# Patient Record
Sex: Female | Born: 1960 | Race: White | Hispanic: No | Marital: Married | Smoking: Current every day smoker
Health system: Southern US, Community
[De-identification: ages and names within clinical notes are randomized; demographics above are authoritative.]

## PROBLEM LIST (undated history)

## (undated) DIAGNOSIS — F419 Anxiety disorder, unspecified: Secondary | ICD-10-CM

## (undated) DIAGNOSIS — Z9889 Other specified postprocedural states: Secondary | ICD-10-CM

## (undated) DIAGNOSIS — M1990 Unspecified osteoarthritis, unspecified site: Secondary | ICD-10-CM

## (undated) DIAGNOSIS — R112 Nausea with vomiting, unspecified: Secondary | ICD-10-CM

## (undated) DIAGNOSIS — N289 Disorder of kidney and ureter, unspecified: Secondary | ICD-10-CM

## (undated) HISTORY — PX: KIDNEY SURGERY: SHX687

## (undated) HISTORY — PX: HERNIA REPAIR: SHX51

---

## 2002-03-11 ENCOUNTER — Ambulatory Visit (HOSPITAL_COMMUNITY): Admission: RE | Admit: 2002-03-11 | Discharge: 2002-03-11 | Payer: Self-pay | Admitting: Obstetrics and Gynecology

## 2002-03-11 ENCOUNTER — Encounter: Payer: Self-pay | Admitting: Obstetrics and Gynecology

## 2003-04-20 ENCOUNTER — Ambulatory Visit (HOSPITAL_COMMUNITY): Admission: RE | Admit: 2003-04-20 | Discharge: 2003-04-20 | Payer: Self-pay | Admitting: Obstetrics and Gynecology

## 2005-01-15 ENCOUNTER — Emergency Department (HOSPITAL_COMMUNITY): Admission: EM | Admit: 2005-01-15 | Discharge: 2005-01-15 | Payer: Self-pay | Admitting: Emergency Medicine

## 2005-01-17 ENCOUNTER — Ambulatory Visit: Payer: Self-pay | Admitting: Orthopedic Surgery

## 2005-01-31 ENCOUNTER — Ambulatory Visit: Payer: Self-pay | Admitting: Orthopedic Surgery

## 2005-02-07 ENCOUNTER — Encounter (HOSPITAL_COMMUNITY): Admission: RE | Admit: 2005-02-07 | Discharge: 2005-02-21 | Payer: Self-pay | Admitting: Orthopedic Surgery

## 2005-03-22 ENCOUNTER — Ambulatory Visit: Payer: Self-pay | Admitting: Orthopedic Surgery

## 2007-09-25 ENCOUNTER — Other Ambulatory Visit: Admission: RE | Admit: 2007-09-25 | Discharge: 2007-09-25 | Payer: Self-pay | Admitting: Obstetrics and Gynecology

## 2008-12-07 ENCOUNTER — Other Ambulatory Visit: Admission: RE | Admit: 2008-12-07 | Discharge: 2008-12-07 | Payer: Self-pay | Admitting: Obstetrics and Gynecology

## 2008-12-13 ENCOUNTER — Ambulatory Visit (HOSPITAL_COMMUNITY): Admission: RE | Admit: 2008-12-13 | Discharge: 2008-12-13 | Payer: Self-pay | Admitting: Obstetrics and Gynecology

## 2011-02-09 ENCOUNTER — Other Ambulatory Visit: Payer: Self-pay | Admitting: Adult Health

## 2011-02-09 ENCOUNTER — Other Ambulatory Visit (HOSPITAL_COMMUNITY)
Admission: RE | Admit: 2011-02-09 | Discharge: 2011-02-09 | Disposition: A | Discharge status: Home / Self Care | Payer: Self-pay | Source: Ambulatory Visit | Attending: Obstetrics and Gynecology | Admitting: Obstetrics and Gynecology

## 2011-02-09 DIAGNOSIS — Z01419 Encounter for gynecological examination (general) (routine) without abnormal findings: Secondary | ICD-10-CM | POA: Insufficient documentation

## 2018-11-26 DIAGNOSIS — E039 Hypothyroidism, unspecified: Secondary | ICD-10-CM | POA: Diagnosis not present

## 2018-11-26 DIAGNOSIS — Z13220 Encounter for screening for lipoid disorders: Secondary | ICD-10-CM | POA: Diagnosis not present

## 2018-11-26 DIAGNOSIS — Z131 Encounter for screening for diabetes mellitus: Secondary | ICD-10-CM | POA: Diagnosis not present

## 2018-11-26 DIAGNOSIS — Z23 Encounter for immunization: Secondary | ICD-10-CM | POA: Diagnosis not present

## 2018-11-26 DIAGNOSIS — R5383 Other fatigue: Secondary | ICD-10-CM | POA: Diagnosis not present

## 2018-12-25 DIAGNOSIS — Z6833 Body mass index (BMI) 33.0-33.9, adult: Secondary | ICD-10-CM | POA: Diagnosis not present

## 2018-12-25 DIAGNOSIS — Z01419 Encounter for gynecological examination (general) (routine) without abnormal findings: Secondary | ICD-10-CM | POA: Diagnosis not present

## 2019-01-05 DIAGNOSIS — K8020 Calculus of gallbladder without cholecystitis without obstruction: Secondary | ICD-10-CM | POA: Diagnosis not present

## 2019-01-05 DIAGNOSIS — K469 Unspecified abdominal hernia without obstruction or gangrene: Secondary | ICD-10-CM | POA: Diagnosis not present

## 2019-01-05 DIAGNOSIS — K760 Fatty (change of) liver, not elsewhere classified: Secondary | ICD-10-CM | POA: Diagnosis not present

## 2019-07-02 ENCOUNTER — Other Ambulatory Visit (HOSPITAL_COMMUNITY): Payer: Self-pay | Admitting: Physician Assistant

## 2019-07-02 DIAGNOSIS — Z1231 Encounter for screening mammogram for malignant neoplasm of breast: Secondary | ICD-10-CM

## 2019-08-05 ENCOUNTER — Ambulatory Visit (HOSPITAL_COMMUNITY)
Admission: RE | Admit: 2019-08-05 | Discharge: 2019-08-05 | Disposition: A | Discharge status: Home / Self Care | Payer: 59 | Source: Ambulatory Visit | Attending: Physician Assistant | Admitting: Physician Assistant

## 2019-08-05 ENCOUNTER — Other Ambulatory Visit: Payer: Self-pay

## 2019-08-05 DIAGNOSIS — Z1231 Encounter for screening mammogram for malignant neoplasm of breast: Secondary | ICD-10-CM | POA: Diagnosis present

## 2021-05-16 IMAGING — MG DIGITAL SCREENING BILAT W/ TOMO W/ CAD
6 of 10 series · 6 of 30 positions shown · non-contrast
Comparison: Previous exam(s).

CLINICAL DATA: Screening.

EXAM:
DIGITAL SCREENING BILATERAL MAMMOGRAM WITH TOMO AND CAD

[L MLO synth-2D]
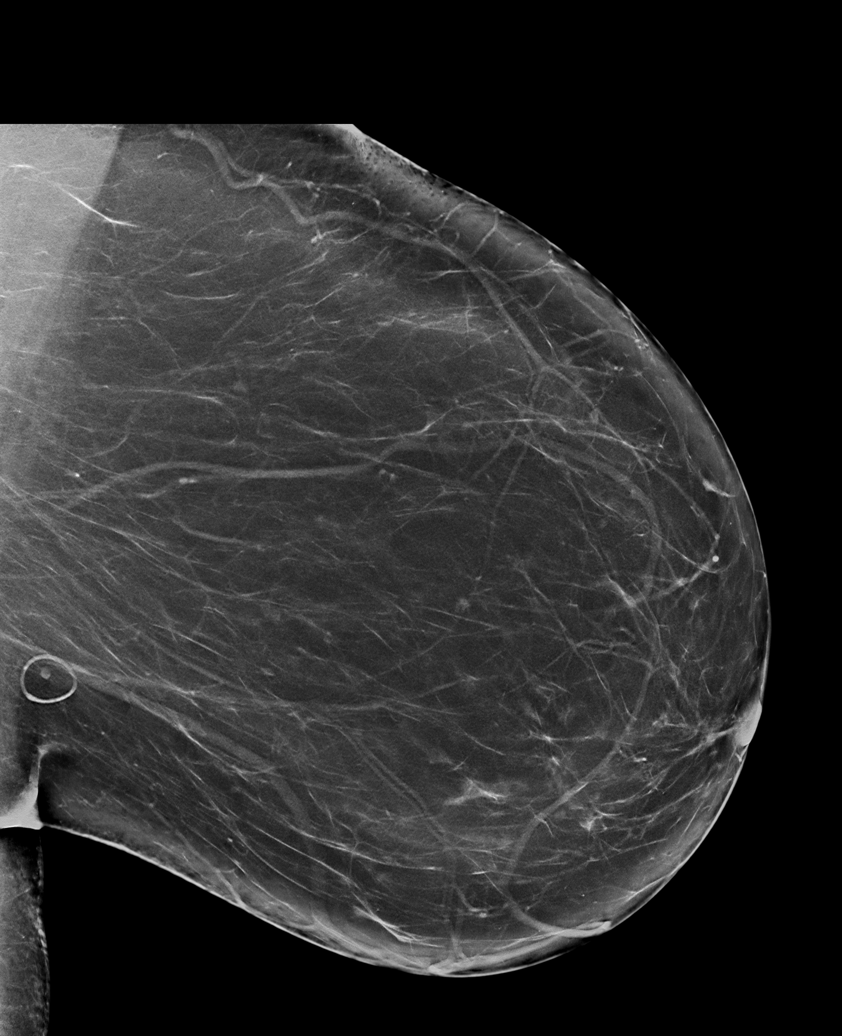

[L CC synth-2D]
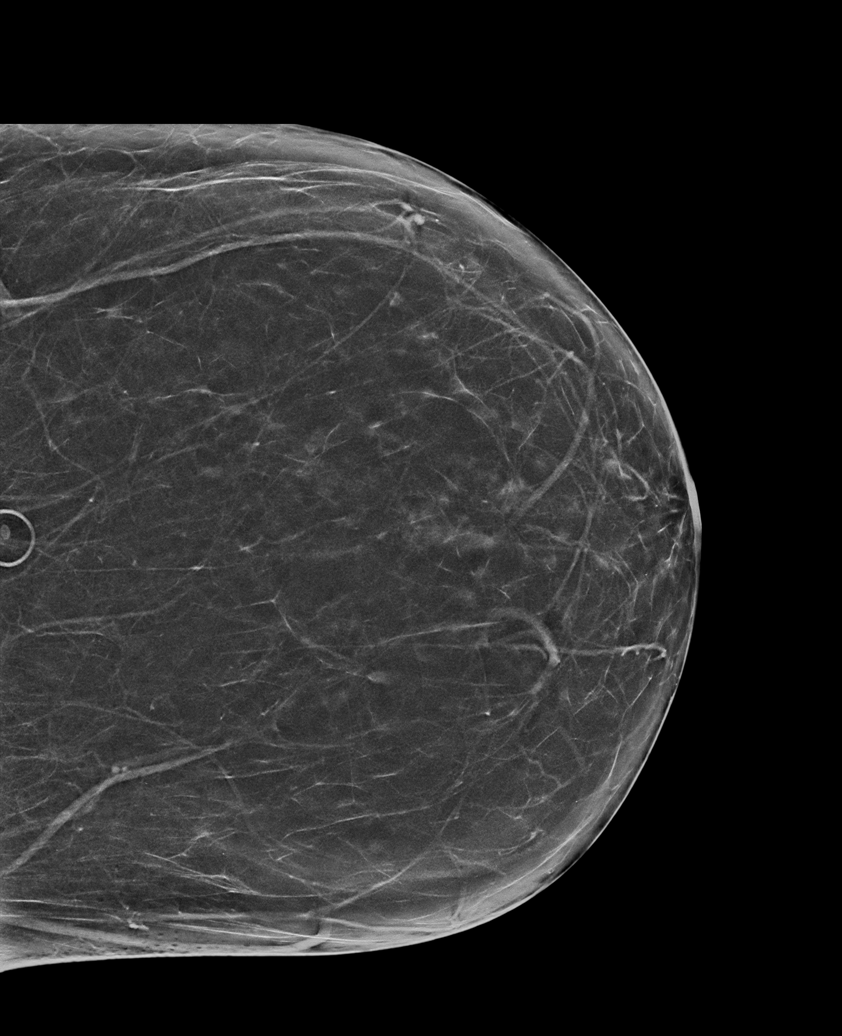

[R MLO synth-2D (1 of 2)]
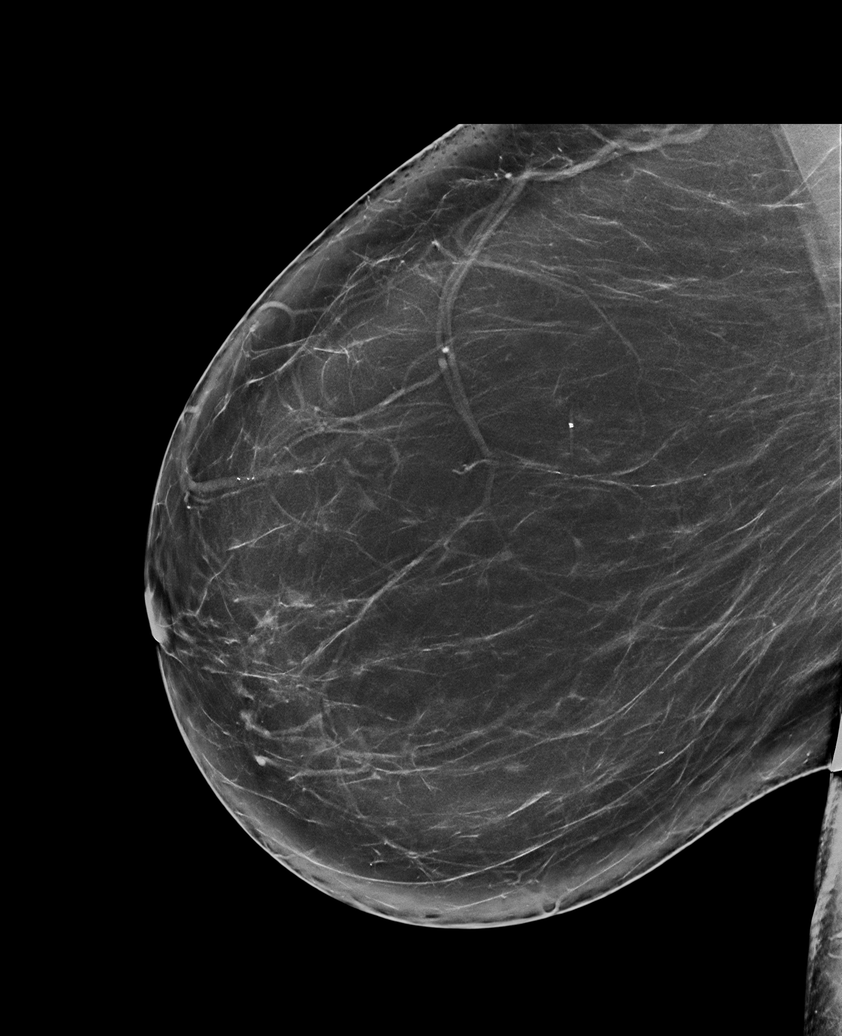

[R CC synth-2D]
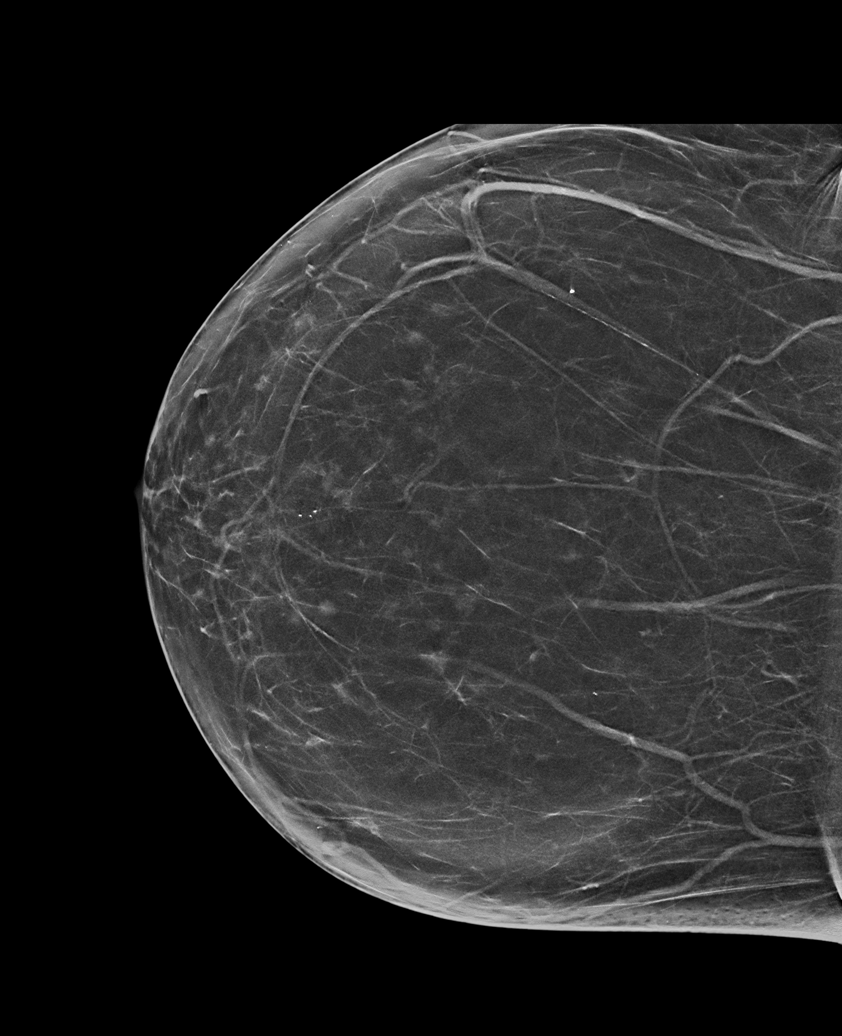

[R MLO synth-2D (2 of 2)]
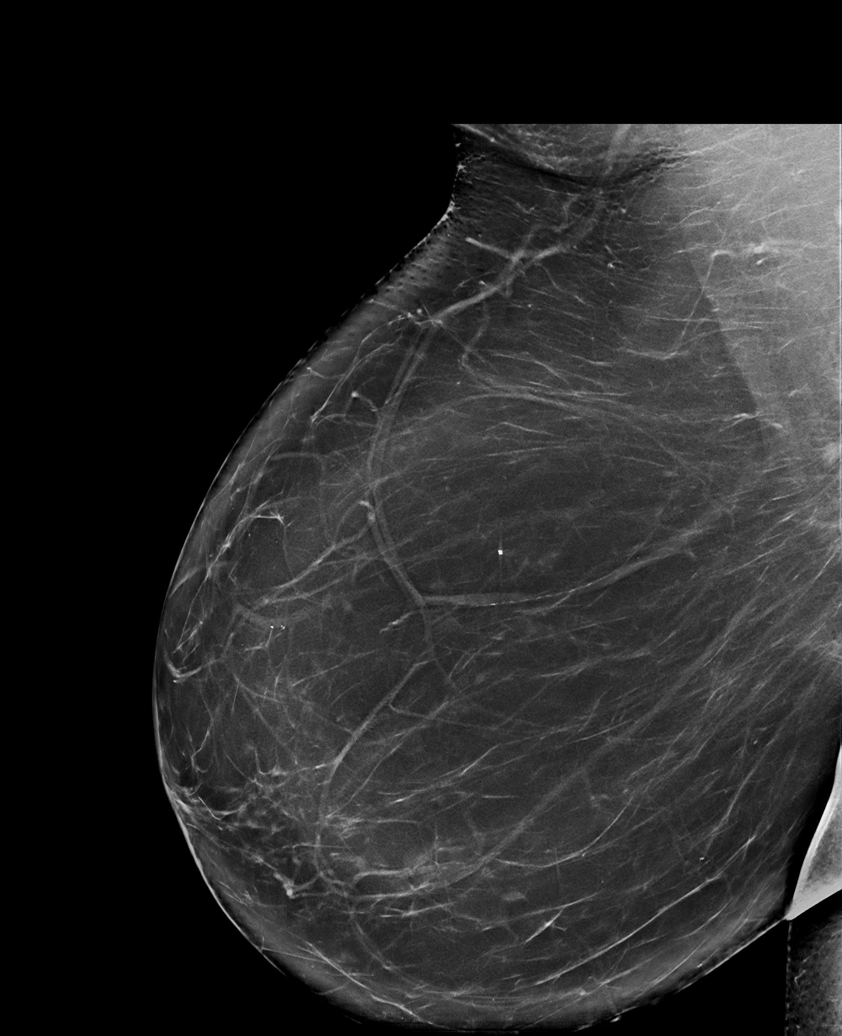

[R CC tomo · tomo slice 37/73.0]
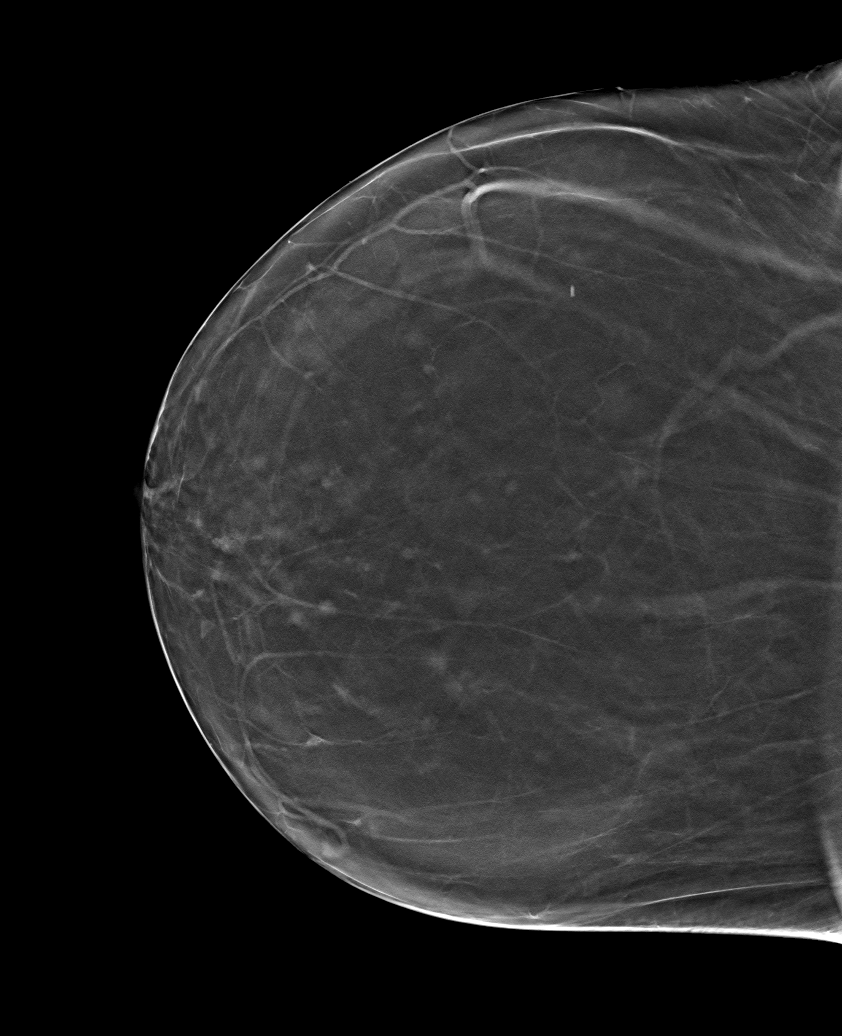

[6 of 30 positions shown; findings below may reference images not displayed]

ACR Breast Density Category b: There are scattered areas of
fibroglandular density.
FINDINGS: There are no findings suspicious for malignancy. Images were
processed with CAD.
IMPRESSION: No mammographic evidence of malignancy. A result letter of this
screening mammogram will be mailed directly to the patient.

RECOMMENDATION:
Screening mammogram in one year. (Code:[id number])

BI-RADS CATEGORY  1: Negative.

## 2022-01-03 ENCOUNTER — Other Ambulatory Visit (HOSPITAL_COMMUNITY): Payer: Self-pay | Admitting: Physician Assistant

## 2022-01-03 DIAGNOSIS — Z1231 Encounter for screening mammogram for malignant neoplasm of breast: Secondary | ICD-10-CM

## 2022-03-18 DIAGNOSIS — Z20822 Contact with and (suspected) exposure to covid-19: Secondary | ICD-10-CM | POA: Diagnosis not present

## 2022-03-18 DIAGNOSIS — J40 Bronchitis, not specified as acute or chronic: Secondary | ICD-10-CM | POA: Diagnosis not present

## 2022-03-18 DIAGNOSIS — R059 Cough, unspecified: Secondary | ICD-10-CM | POA: Diagnosis not present

## 2022-04-11 ENCOUNTER — Ambulatory Visit (HOSPITAL_COMMUNITY)
Admission: RE | Admit: 2022-04-11 | Discharge: 2022-04-11 | Disposition: A | Discharge status: Home / Self Care | Payer: 59 | Source: Ambulatory Visit | Attending: Physician Assistant | Admitting: Physician Assistant

## 2022-04-11 DIAGNOSIS — Z1231 Encounter for screening mammogram for malignant neoplasm of breast: Secondary | ICD-10-CM | POA: Insufficient documentation

## 2022-04-16 ENCOUNTER — Other Ambulatory Visit (HOSPITAL_COMMUNITY): Payer: Self-pay | Admitting: Physician Assistant

## 2022-04-16 DIAGNOSIS — R928 Other abnormal and inconclusive findings on diagnostic imaging of breast: Secondary | ICD-10-CM

## 2022-05-01 ENCOUNTER — Ambulatory Visit (HOSPITAL_COMMUNITY)
Admission: RE | Admit: 2022-05-01 | Discharge: 2022-05-01 | Disposition: A | Discharge status: Home / Self Care | Payer: 59 | Source: Ambulatory Visit | Attending: Physician Assistant | Admitting: Physician Assistant

## 2022-05-01 ENCOUNTER — Encounter (HOSPITAL_COMMUNITY): Payer: Self-pay

## 2022-05-01 DIAGNOSIS — R928 Other abnormal and inconclusive findings on diagnostic imaging of breast: Secondary | ICD-10-CM

## 2022-06-21 ENCOUNTER — Encounter (HOSPITAL_COMMUNITY): Payer: Self-pay | Admitting: Emergency Medicine

## 2022-06-21 ENCOUNTER — Other Ambulatory Visit: Payer: Self-pay

## 2022-06-21 ENCOUNTER — Emergency Department (HOSPITAL_COMMUNITY): Payer: Medicaid Other

## 2022-06-21 ENCOUNTER — Emergency Department (HOSPITAL_COMMUNITY)
Admission: EM | Admit: 2022-06-21 | Discharge: 2022-06-22 | Disposition: A | Discharge status: Home / Self Care | Payer: Medicaid Other | Attending: Emergency Medicine | Admitting: Emergency Medicine

## 2022-06-21 DIAGNOSIS — Z20822 Contact with and (suspected) exposure to covid-19: Secondary | ICD-10-CM | POA: Insufficient documentation

## 2022-06-21 DIAGNOSIS — R509 Fever, unspecified: Secondary | ICD-10-CM | POA: Diagnosis present

## 2022-06-21 DIAGNOSIS — K8689 Other specified diseases of pancreas: Secondary | ICD-10-CM

## 2022-06-21 DIAGNOSIS — M542 Cervicalgia: Secondary | ICD-10-CM | POA: Diagnosis not present

## 2022-06-21 DIAGNOSIS — R519 Headache, unspecified: Secondary | ICD-10-CM | POA: Insufficient documentation

## 2022-06-21 DIAGNOSIS — N12 Tubulo-interstitial nephritis, not specified as acute or chronic: Secondary | ICD-10-CM | POA: Insufficient documentation

## 2022-06-21 DIAGNOSIS — R251 Tremor, unspecified: Secondary | ICD-10-CM | POA: Insufficient documentation

## 2022-06-21 DIAGNOSIS — K869 Disease of pancreas, unspecified: Secondary | ICD-10-CM | POA: Diagnosis not present

## 2022-06-21 HISTORY — DX: Anxiety disorder, unspecified: F41.9

## 2022-06-21 HISTORY — DX: Disorder of kidney and ureter, unspecified: N28.9

## 2022-06-21 HISTORY — DX: Unspecified osteoarthritis, unspecified site: M19.90

## 2022-06-21 LAB — CBC
HCT: 39.8 % (ref 36.0–46.0)
Hemoglobin: 12.1 g/dL (ref 12.0–15.0)
MCH: 24.9 pg — ABNORMAL LOW (ref 26.0–34.0)
MCHC: 30.4 g/dL (ref 30.0–36.0)
MCV: 81.9 fL (ref 80.0–100.0)
Platelets: 227 10*3/uL (ref 150–400)
RBC: 4.86 MIL/uL (ref 3.87–5.11)
RDW: 15 % (ref 11.5–15.5)
WBC: 11 10*3/uL — ABNORMAL HIGH (ref 4.0–10.5)
nRBC: 0 % (ref 0.0–0.2)

## 2022-06-21 LAB — BASIC METABOLIC PANEL
Anion gap: 9 (ref 5–15)
BUN: 13 mg/dL (ref 6–20)
CO2: 25 mmol/L (ref 22–32)
Calcium: 8.5 mg/dL — ABNORMAL LOW (ref 8.9–10.3)
Chloride: 100 mmol/L (ref 98–111)
Creatinine, Ser: 0.86 mg/dL (ref 0.44–1.00)
GFR, Estimated: >60 mL/min (ref >60)
Glucose, Bld: 133 mg/dL — ABNORMAL HIGH (ref 70–99)
Potassium: 3.9 mmol/L (ref 3.5–5.1)
Sodium: 134 mmol/L — ABNORMAL LOW (ref 135–145)

## 2022-06-21 LAB — TROPONIN I (HIGH SENSITIVITY)
Troponin I (High Sensitivity): 17 ng/L (ref <18)
Troponin I (High Sensitivity): 14 ng/L (ref <18)

## 2022-06-21 MED ORDER — LACTATED RINGERS IV BOLUS
1000.0000 mL | Freq: Once | INTRAVENOUS | Status: AC
  Administered 2022-06-22: 1000 mL via INTRAVENOUS

## 2022-06-21 NOTE — ED Triage Notes (Signed)
Pt brought in by family from home after pt had seizure like episode this afternoon that last about 1 hr. Pt states she had similar episode yesterday. Pt c/o fatigue, fever, chills, and just not feeling well.

## 2022-06-22 ENCOUNTER — Emergency Department (HOSPITAL_COMMUNITY): Payer: Medicaid Other

## 2022-06-22 LAB — URINALYSIS, W/ REFLEX TO CULTURE (INFECTION SUSPECTED)
Bilirubin Urine: NEGATIVE
Glucose, UA: 50 mg/dL — AB
Hgb urine dipstick: NEGATIVE
Ketones, ur: 20 mg/dL — AB
Nitrite: NEGATIVE
Protein, ur: 30 mg/dL — AB
Specific Gravity, Urine: 1.017 (ref 1.005–1.030)
WBC, UA: >50 WBC/hpf (ref 0–5)
pH: 6 (ref 5.0–8.0)

## 2022-06-22 LAB — HEPATIC FUNCTION PANEL
ALT: 31 U/L (ref 0–44)
AST: 28 U/L (ref 15–41)
Albumin: 3.8 g/dL (ref 3.5–5.0)
Alkaline Phosphatase: 64 U/L (ref 38–126)
Bilirubin, Direct: 0.1 mg/dL (ref 0.0–0.2)
Indirect Bilirubin: 0.3 mg/dL (ref 0.3–0.9)
Total Bilirubin: 0.4 mg/dL (ref 0.3–1.2)
Total Protein: 7.7 g/dL (ref 6.5–8.1)

## 2022-06-22 LAB — RESP PANEL BY RT-PCR (RSV, FLU A&B, COVID)  RVPGX2
Influenza A by PCR: NEGATIVE
Influenza B by PCR: NEGATIVE
Resp Syncytial Virus by PCR: NEGATIVE
SARS Coronavirus 2 by RT PCR: NEGATIVE

## 2022-06-22 LAB — MAGNESIUM: Magnesium: 1.9 mg/dL (ref 1.7–2.4)

## 2022-06-22 LAB — LIPASE, BLOOD: Lipase: 25 U/L (ref 11–51)

## 2022-06-22 LAB — CK: Total CK: 59 U/L (ref 38–234)

## 2022-06-22 MED ORDER — METOCLOPRAMIDE HCL 5 MG/ML IJ SOLN
10.0000 mg | Freq: Once | INTRAMUSCULAR | Status: AC
  Administered 2022-06-22: 10 mg via INTRAVENOUS
  Filled 2022-06-22: qty 2

## 2022-06-22 MED ORDER — IOHEXOL 300 MG/ML  SOLN
100.0000 mL | Freq: Once | INTRAMUSCULAR | Status: AC | PRN
  Administered 2022-06-22: 100 mL via INTRAVENOUS

## 2022-06-22 MED ORDER — CEPHALEXIN 500 MG PO CAPS: 500.0000 mg | ORAL_CAPSULE | Freq: Four times a day (QID) | ORAL | 0 refills | Status: DC

## 2022-06-22 MED ORDER — SODIUM CHLORIDE 0.9 % IV SOLN
2.0000 g | Freq: Once | INTRAVENOUS | Status: AC
  Administered 2022-06-22: 2 g via INTRAVENOUS
  Filled 2022-06-22: qty 20

## 2022-06-22 MED ORDER — LACTATED RINGERS IV BOLUS
1000.0000 mL | Freq: Once | INTRAVENOUS | Status: AC
  Administered 2022-06-22: 1000 mL via INTRAVENOUS

## 2022-06-22 MED ORDER — ONDANSETRON 4 MG PO TBDP: ORAL_TABLET | ORAL | 0 refills | Status: DC

## 2022-06-22 MED ORDER — SODIUM CHLORIDE 0.9 % IV SOLN: 2.0000 g | Freq: Once | INTRAVENOUS | Status: DC

## 2022-06-22 MED ORDER — ONDANSETRON HCL 4 MG PO TABS: 4.0000 mg | ORAL_TABLET | Freq: Three times a day (TID) | ORAL | 0 refills | Status: DC | PRN

## 2022-06-22 MED ORDER — ACETAMINOPHEN 500 MG PO TABS
1000.0000 mg | ORAL_TABLET | Freq: Once | ORAL | Status: AC
  Administered 2022-06-22: 1000 mg via ORAL
  Filled 2022-06-22: qty 2

## 2022-06-22 NOTE — ED Notes (Addendum)
Pt's daughter admitted to the RN and in front of this tech that she has been hooking and unhooking the pt's IV to let her mother go to restroom, due to [place] not being fast enough. RN and Press photographer told pt's daughter it is very busy and the assignment the nurse has is very busy. Daughter's tone was getting louder and being aggressive with her words toward RN and Press photographer. Security at nurses station at this time.

## 2022-06-22 NOTE — ED Provider Notes (Signed)
[place] EMERGENCY DEPARTMENT AT [facility] Provider Note   CSN: [id number] Arrival date & time: 06/21/22  1901     History  Chief Complaint  Patient presents with   Seizure Like Activity    Alexis Cain is a 62 y.o. female.  62 year old female who presents ER today for shaking episodes.  She thinks that they might be seizures.  She states that she will have 1 to 2 minutes of full body shaking that she cannot get to stop.  She tries to hold herself still and it does not seem to go away.  She states these coincided with fevers as high as 103.  She states that she has had some malodorous urine and a headache when her temperature was high as well.  Feels like a migraine.  She has left-sided neck pain as well.  No cough.  No nausea or vomiting.  No constipation or diarrhea.  No abdominal pain.        Home Medications Prior to Admission medications   Medication Sig Start Date End Date Taking? Authorizing Provider  cephALEXin (KEFLEX) 500 MG capsule Take 1 capsule (500 mg total) by mouth 4 (four) times daily. 06/22/22  Yes Shawntrice Salle, Barbara Cower, MD  Cholecalciferol (VITAMIN D3) 25 MCG (1000 UT) CAPS Take 2,000 Units by mouth daily.   Yes [provider]  citalopram (CELEXA) 20 MG tablet Take 30 mg by mouth daily. 05/20/22  Yes [provider]  Cyanocobalamin (VITAMIN B-12 CR PO) Take by mouth.   Yes [provider]  ondansetron (ZOFRAN) 4 MG tablet Take 1 tablet (4 mg total) by mouth every 8 (eight) hours as needed for nausea or vomiting. 06/22/22  Yes Zamirah Denny, Barbara Cower, MD  ondansetron (ZOFRAN-ODT) 4 MG disintegrating tablet  ODT q4 hours prn nausea/vomit 06/22/22  Yes Sera Hitsman, Barbara Cower, MD  Turmeric (QC TUMERIC COMPLEX) 500 MG CAPS Take 2 tablets by mouth daily.   Yes [provider]  albuterol (VENTOLIN HFA) 108 (90 Base) MCG/ACT inhaler SMARTSIG:2 Puff(s) By Mouth Every 4-6 Hours PRN Patient not taking: Reported on 06/21/2022 03/18/22   [provider]      Allergies    Sulfa antibiotics    Review of Systems   Review of Systems  Physical Exam Updated Vital Signs BP (!) 150/87   Pulse 91   Temp 98.9 F (37.2 C) (Oral)   Resp 16   Ht  (1.651 m)   Wt 104.3 kg   SpO2 96%   BMI 38.27 kg/m  Physical Exam Vitals and nursing note reviewed.  Constitutional:      Appearance: She is well-developed.  HENT:     Head: Normocephalic and atraumatic.     Mouth/Throat:     Mouth: Mucous membranes are dry.  Eyes:     Pupils: Pupils are equal, round, and reactive to light.  Cardiovascular:     Rate and Rhythm: Normal rate and regular rhythm.  Pulmonary:     Effort: No respiratory distress.     Breath sounds: No stridor.  Abdominal:     General: Abdomen is flat. There is no distension.  Musculoskeletal:        General: Tenderness (L SCM) present. Normal range of motion.     Cervical back: Normal range of motion.  Skin:    General: Skin is warm and dry.  Neurological:     General: No focal deficit present.     Mental Status: She is alert.  ED Results / Procedures / Treatments   Labs (all labs ordered are listed, but only abnormal results are displayed) Labs Reviewed  BASIC METABOLIC PANEL - Abnormal; Notable for the following components:      Result Value   Sodium 134 (*)    Glucose, Bld 133 (*)    Calcium 8.5 (*)    All other components within normal limits  CBC - Abnormal; Notable for the following components:   WBC 11.0 (*)    MCH 24.9 (*)    All other components within normal limits  URINALYSIS, W/ REFLEX TO CULTURE (INFECTION SUSPECTED) - Abnormal; Notable for the following components:   APPearance HAZY (*)    Glucose, UA 50 (*)    Ketones, ur 20 (*)    Protein, ur 30 (*)    Leukocytes,Ua SMALL (*)    Bacteria, UA MANY (*)    All other components within normal limits  RESP PANEL BY RT-PCR (RSV, FLU A&B, COVID)  RVPGX2  URINE CULTURE  CULTURE, BLOOD (SINGLE)  CK  MAGNESIUM  HEPATIC  FUNCTION PANEL  LIPASE, BLOOD  CBG MONITORING, ED  TROPONIN I (HIGH SENSITIVITY)  TROPONIN I (HIGH SENSITIVITY)    EKG None  Radiology CT ABDOMEN PELVIS W CONTRAST  Result Date: 06/22/2022 CLINICAL DATA:  Seizure-like episode, fever/chills EXAM: CT ABDOMEN AND PELVIS WITH CONTRAST TECHNIQUE: Multidetector CT imaging of the abdomen and pelvis was performed using the standard protocol following bolus administration of intravenous contrast. RADIATION DOSE REDUCTION: This exam was performed according to the departmental dose-optimization program which includes automated exposure control, adjustment of the mA and/or kV according to patient size and/or use of iterative reconstruction technique. CONTRAST:  OMNIPAQUE IOHEXOL 300 MG/ML  SOLN COMPARISON:  None Available. FINDINGS: Lower chest: Lung bases are clear. Hepatobiliary: Liver is within normal limits. Cholelithiasis, without associated inflammatory changes to suggest acute cholecystitis. No intrahepatic or extrahepatic ductal dilatation. Pancreas: Suspected 3.4 x 2.5 x 5.6 cm mass in the pancreatic head/uncinate process (series 2/image 35; sagittal image 59) concerning for primary pancreatic neoplasm. Associated atrophy of the pancreatic body/tail (series 2/image 29). Spleen: Within normal limits. Adrenals/Urinary Tract: Adrenal glands are within normal limits. Punctate nonobstructing right lower pole renal calculus (series 2/image 52). Heterogeneous enhancement of the medial interpolar right kidney (series 2/image 47) with additional 2.5 cm vague lesion in the posterior right upper kidney (series 2/image 42), favored to be related to pyelonephritis given associated mild perinephric stranding/fluid (series 2/image 45). No hydronephrosis. Status post left nephrectomy. Bladder is notable for trace nondependent gas (series 2/image 36). Stomach/Bowel: Stomach is within normal limits. No evidence of bowel obstruction. Normal appendix (series 2/image  59). Sigmoid diverticulosis, without evidence of diverticulitis. Vascular/Lymphatic: No evidence of abdominal aortic aneurysm. No suspicious abdominopelvic lymphadenopathy. Reproductive: Mildly heterogeneous uterus, suggesting uterine fibroids. Bilateral ovaries are within normal limits. Other: No abdominopelvic ascites. Large left lateral hernia containing multiple nondilated loops of small bowel (series 2/image 50), without fluid or inflammatory changes. Musculoskeletal: Visualized osseous structures are within normal limits. 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 Associated atrophy of the pancreatic body/tail. EUS is suggested for tissue confirmation. Heterogeneous enhancement of the right kidney, favored to be related to pyelonephritis. Additional ancillary findings as above. Electronically Signed   By: Charline Bills M.D.   On: 06/22/2022 02:19   CT Head Wo Contrast  Result Date: 06/21/2022 CLINICAL DATA:  Seizure-like episode EXAM: CT HEAD WITHOUT CONTRAST TECHNIQUE: Contiguous axial images  were obtained from the base of the skull through the vertex without intravenous contrast. RADIATION DOSE REDUCTION: This exam was performed according to the departmental dose-optimization program which includes automated exposure control, adjustment of the mA and/or kV according to patient size and/or use of iterative reconstruction technique. COMPARISON:  None Available. FINDINGS: Brain: No evidence of acute infarction, hemorrhage, hydrocephalus, extra-axial collection or mass lesion/mass effect. Mild subcortical white matter and periventricular small vessel ischemic changes. Vascular: No hyperdense vessel or unexpected calcification. Skull: Normal. Negative for fracture or focal lesion. Sinuses/Orbits: The visualized paranasal sinuses are essentially clear. The mastoid air cells are unopacified. Other: None. IMPRESSION: No acute  intracranial abnormality. Mild small vessel ischemic changes. Electronically Signed   By: Charline Bills M.D.   On: 06/21/2022 21:36   DG Chest 2 View  Result Date: 06/21/2022 CLINICAL DATA:  Chest pain EXAM: CHEST - 2 VIEW COMPARISON:  None Available. FINDINGS: Lungs are clear.  No pleural effusion or pneumothorax. The heart is normal in size. Visualized osseous structures are within normal limits. IMPRESSION: Normal chest radiographs. Electronically Signed   By: Charline Bills M.D.   On: 06/21/2022 20:17    Procedures Procedures    Medications Ordered in ED Medications  lactated ringers bolus 1,000 mL (0 mLs Intravenous Stopped 06/22/22 0544)  cefTRIAXone (ROCEPHIN) 2 g in sodium chloride 0.9 % 100 mL IVPB (0 g Intravenous Stopped 06/22/22 0503)  iohexol (OMNIPAQUE) 300 MG/ML solution 100 mL (100 mLs Intravenous Contrast Given 06/22/22 0201)  metoCLOPramide (REGLAN) injection 10 mg (10 mg Intravenous Given 06/22/22 0450)  lactated ringers bolus 1,000 mL (0 mLs Intravenous Stopped 06/22/22 0503)  acetaminophen (TYLENOL) tablet 1,000 mg (1,000 mg Oral Given 06/22/22 1610)    ED Course/ Medical Decision Making/ A&P                             Medical Decision Making Amount and/or Complexity of Data Reviewed Labs: ordered. Radiology: ordered. ECG/medicine tests: ordered.  Risk OTC drugs. Prescription drug management.  I think patients are more accurately described as rigors or other than seizures that she was conscious the whole time and had a high fever at the same time.  Will evaluate her urine as a possible cause.  Chest x-ray is clear and her lungs were clear.  Consider possible meningitis that she does have a headache however she has no nuchal rigidity or other meningeal signs I think the headache is probably related to a viral syndrome versus the fever itself.  Will check a COVID/flu/RSV.  Further workup pending results of these test. Patient found to have a UTI however  secondary to the tachycardia, wbc and her having what sounds like rigors earlier a blood culture drawn, IV abx, fluids given and ct to eval for pyelo. This showed pyelo but also a pancreatic mass. I discussed at length with patient, daughter and cousin and sent message to Dr. Ellin Saba to try and expedite workup. Office number provided to patient as well to ensure she can call for appt if needed.  When ambulating patient because nauseous. Antiemetics provided, another liter of fluids. Discussed admission however patient prefers not so observed for a couple hours. HR continued to improve. She felt fine. Tolerated PO. Plan for d/c on 10d abx course and pcp follow up for pyelo but onc fu for pancreatic mass.   Final Clinical Impression(s) / ED Diagnoses Final diagnoses:  Pyelonephritis  Pancreatic mass    Rx /  DC Orders ED Discharge Orders          Ordered    ondansetron (ZOFRAN) 4 MG tablet  Every 8 hours PRN        06/22/22 0329    ondansetron (ZOFRAN-ODT) 4 MG disintegrating tablet        06/22/22 0330    cephALEXin (KEFLEX) 500 MG capsule  4 times daily        06/22/22 0330    Fluid Challenge        Pending              Kenli Waldo, Barbara Cower, MD 06/22/22 [phone]

## 2022-06-22 NOTE — ED Notes (Signed)
Pt drank gingerale and has kept it down for 

## 2022-06-22 NOTE — ED Notes (Signed)
Pts daughter unhooked pts iv at the pigtail to take pt to bathroom. Pt's daughter was asked to not unhook and rehook the ivs, This was after the pts daughter was asked twice not to touch the iv pumps because she was attempting to reprogram the pump. When asked to not unhook the iv tubing from the iv pigtail the pts daughter became very aggressive with this nurse to the point that the charge nurse had to call security.

## 2022-06-24 LAB — URINE CULTURE: Culture: >=100000 — AB

## 2022-06-24 LAB — CULTURE, BLOOD (SINGLE): Special Requests: ADEQUATE

## 2022-06-25 ENCOUNTER — Telehealth (HOSPITAL_BASED_OUTPATIENT_CLINIC_OR_DEPARTMENT_OTHER): Payer: Self-pay | Admitting: *Deleted

## 2022-06-25 LAB — CULTURE, BLOOD (SINGLE)

## 2022-06-25 MED FILL — Ondansetron HCl Tab 4 MG: ORAL | Qty: 4 | Status: AC

## 2022-06-25 NOTE — Telephone Encounter (Signed)
Post ED Visit - Positive Culture Follow-up  Culture report reviewed by antimicrobial stewardship pharmacist: [facility]  Enzo Bi, Pharm.D.  Celedonio Miyamoto, Pharm.D., BCPS AQ-ID  Garvin Fila, Pharm.D., BCPS  Georgina Pillion, Pharm.D., BCPS  [place], [address].D., BCPS, AAHIVP  Estella Husk, Pharm.D., BCPS, AAHIVP  Lysle Pearl, PharmD, BCPS  Phillips Climes, PharmD, BCPS  Agapito Games, PharmD, BCPS  Verlan Friends, PharmD  Mervyn Gay, PharmD, BCPS  Vinnie Level, PharmD  [facility]  Len Childs, PharmD  Greer Pickerel, PharmD  Adalberto Cole, PharmD  Perlie Gold, Rph  Lonell Face) Jean Rosenthal, PharmD  Earl Many, PharmD  Junita Push, PharmD  Dorna Leitz, PharmD  Terrilee Files, PharmD  Lynann Beaver, PharmD  Keturah Barre, PharmD  Loralee Pacas, PharmD  Bernadene Person, PharmD   Positive urine culture Treated with Cephalexin, organism sensitive to the same and no further patient follow-up is required at this time. Lorin Bell Pharm D  [place] 06/25/2022, 10:19 AM

## 2022-06-27 DIAGNOSIS — K8689 Other specified diseases of pancreas: Secondary | ICD-10-CM | POA: Insufficient documentation

## 2022-06-27 LAB — CULTURE, BLOOD (SINGLE): Culture: NO GROWTH

## 2022-06-27 NOTE — Progress Notes (Signed)
[facility] [address], [place] [address]   Clinic Day:  06/28/2022  Referring physician: [facility]  Patient Care Team: [facility] as PCP - General Doreatha Massed, MD as Medical Oncologist (Medical Oncology) Therese Sarah, RN as Oncology Nurse Navigator (Medical Oncology)   ASSESSMENT & PLAN:   Assessment:  1.  Pancreatic head mass: - Patient went to the ER with symptoms of right-sided pyelonephritis. - CTAP (06/22/2022): 5.6 cm mass in the pancreatic head/uncinate process concerning for primary pancreatic neoplasm.  Associated atrophy of the pancreatic body/tail. - She denies any weight loss or abdominal pain.  Currently on antibiotics cephalexin 500 4 times daily for pyelonephritis. - She has numbness in the feet only when the feet swell after prolonged standing.  2.  Social/family history: - Lives at home with her husband.  Currently not working.  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  She also form tobacco many years ago.  She currently vapes for the past 10 years.  She smoked 1 pack of cigarettes per day for 20 years prior to vaping. - Father had cholangiocarcinoma.  Brother had throat cancer and was smoker.  Paternal uncle had lung cancer.  2 maternal aunts had cervical cancer.  Plan:  1.  Pancreatic head mass: - I have reviewed images of the CT scan with the patient in detail. - Clinically highly suspicious for pancreatic carcinoma. - Recommend CT pancreatic protocol. - Recommend PET CT scan for staging. - Recommend evaluation by Dr. Meridee Score for EUS/biopsy. - Recommend germline mutation testing. - Will also send CA 19-9 level.   Orders Placed This Encounter  Procedures   NM PET Image Initial (PI) Skull Base To Thigh    Standing Status:   Future    Standing Expiration Date:   06/28/2023    Order Specific Question:   If indicated for the ordered procedure, I authorize the  administration of a radiopharmaceutical per Radiology protocol    Answer:   Yes    Order Specific Question:   Is the patient pregnant?    Answer:   No    Order Specific Question:   Preferred imaging location?    Answer:   [facility]    Order Specific Question:   Release to patient    Answer:   Immediate   CT Abdomen W Contrast    Standing Status:   Future    Standing Expiration Date:   06/28/2023    Order Specific Question:   If indicated for the ordered procedure, I authorize the administration of contrast media per Radiology protocol    Answer:   Yes    Order Specific Question:   Does the patient have a contrast media/X-ray dye allergy?    Answer:   No    Order Specific Question:   Is patient pregnant?    Answer:   No    Order Specific Question:   Preferred imaging location?    Answer:   [facility]    Order Specific Question:   Release to patient    Answer:   Immediate [1]    Order Specific Question:   If indicated for the ordered procedure, I authorize the administration of oral contrast media per Radiology protocol    Answer:   Yes   Genetic Screening Order    Standing Status:   Future    Number of Occurrences:   1    Standing Expiration Date:  06/28/2023    Order Specific Question:   Release to patient    Answer:   Immediate   Ferritin    Standing Status:   Future    Number of Occurrences:   1    Standing Expiration Date:   06/28/2023   Iron and TIBC ([facility] ONLY)    Standing Status:   Future    Number of Occurrences:   1    Standing Expiration Date:   06/28/2023   Ambulatory referral to Genetics    Referral Priority:   Routine    Referral Type:   Consultation    Referral Reason:   Specialty Services Required    Number of Visits Requested:   1      I,Katie Daubenspeck,acting as a scribe for Doreatha Massed, MD.,have documented all relevant documentation on the behalf of Doreatha Massed, MD,as directed by  Doreatha Massed, MD  while in the presence of Doreatha Massed, MD.   I, Doreatha Massed MD, have reviewed the above documentation for accuracy and completeness, and I agree with the above.   Doreatha Massed, MD   4/25/20246:20 PM  CHIEF COMPLAINT/PURPOSE OF CONSULT:   Diagnosis: pancreatic head mass   Cancer Staging  No matching staging information was found for the patient.   Prior Therapy: none  Current Therapy: Under workup   HISTORY OF PRESENT ILLNESS:   Oncology History   No history exists.      Phelicia is a 62 y.o. female presenting to clinic today for evaluation of pancreatic head mass at the request of hospitalist Dr. Clayborne Dana.  She presented to the ED on 06/21/22 with shaking episodes with associated fevers, malodorous urine, and headache. As part of her work up, she underwent CT A/P showing:5.6 cm mass in pancreatic head/uncinate process, concerning for primary pancreatic neoplasm; heterogeneous enhancement of right kidney, favored to be related to pyelonephritis. Of note, she was given fluids and discharged with a course of antibiotics for UTI.  Today, she states that she is doing well overall. Her appetite level is at 100%. Her energy level is at 60%.  PAST MEDICAL HISTORY:   Past Medical History: Past Medical History:  Diagnosis Date   Anxiety    Arthritis    Renal disorder     Surgical History: Past Surgical History:  Procedure Laterality Date   HERNIA REPAIR     KIDNEY SURGERY Left     Social History: Social History   Socioeconomic History   Marital status: Married    Spouse name: Not on file   Number of children: Not on file   Years of education: Not on file   Highest education level: Not on file  Occupational History   Not on file  Tobacco Use   Smoking status: Every Day    Types: E-cigarettes   Smokeless tobacco: Current  Vaping Use   Vaping Use: Every day  Substance and Sexual Activity   Alcohol use: Not Currently    Comment: socially   Drug  use: Never   Sexual activity: Yes  Other Topics Concern   Not on file  Social History Narrative   Not on file   Social Determinants of Health   Financial Resource Strain: Not on file  Food Insecurity: No Food Insecurity (06/28/2022)   Hunger Vital Sign    Worried About Running Out of Food in the Last Year: Never true    Ran Out of Food in the Last Year: Never true  Transportation Needs: No Transportation  Needs (06/28/2022)   PRAPARE - Administrator, Civil Service (Medical): No    Lack of Transportation (Non-Medical): No  Physical Activity: Not on file  Stress: Not on file  Social Connections: Not on file  Intimate Partner Violence: Not At Risk (06/28/2022)   Humiliation, Afraid, Rape, and Kick questionnaire    Fear of Current or Ex-Partner: No    Emotionally Abused: No    Physically Abused: No    Sexually Abused: No    Family History: History reviewed. No pertinent family history.  Current Medications:  Current Outpatient Medications:    albuterol (VENTOLIN HFA) 108 (90 Base) MCG/ACT inhaler, , Disp: , Rfl:    cephALEXin (KEFLEX) 500 MG capsule, Take 1 capsule (500 mg total) by mouth 4 (four) times daily., Disp: 40 capsule, Rfl: 0   Cholecalciferol (VITAMIN D3) 25 MCG (1000 UT) CAPS, Take 2,000 Units by mouth daily., Disp: , Rfl:    citalopram (CELEXA) 20 MG tablet, Take 30 mg by mouth daily., Disp: , Rfl:    Cyanocobalamin (VITAMIN B-12 CR PO), Take by mouth., Disp: , Rfl:    Turmeric (QC TUMERIC COMPLEX) 500 MG CAPS, Take 2 tablets by mouth daily., Disp: , Rfl:    ondansetron (ZOFRAN) 4 MG tablet, Take 1 tablet (4 mg total) by mouth every 8 (eight) hours as needed for nausea or vomiting. (Patient not taking: Reported on 06/28/2022), Disp: 4 tablet, Rfl: 0   ondansetron (ZOFRAN-ODT) 4 MG disintegrating tablet,  ODT q4 hours prn nausea/vomit (Patient not taking: Reported on 06/28/2022), Disp: 30 tablet, Rfl: 0   Allergies: Allergies  Allergen Reactions   Sulfa  Antibiotics Anaphylaxis    REVIEW OF SYSTEMS:   Review of Systems  Constitutional:  Negative for chills, fatigue and fever.  HENT:   Negative for lump/mass, mouth sores, nosebleeds, sore throat and trouble swallowing.   Eyes:  Negative for eye problems.  Respiratory:  Negative for cough and shortness of breath.   Cardiovascular:  Positive for leg swelling. Negative for chest pain and palpitations.  Gastrointestinal:  Positive for diarrhea and nausea. Negative for abdominal pain, constipation and vomiting.  Genitourinary:  Negative for bladder incontinence, difficulty urinating, dysuria, frequency, hematuria and nocturia.   Musculoskeletal:  Negative for arthralgias, back pain, flank pain, myalgias and neck pain.  Skin:  Negative for itching and rash.  Neurological:  Positive for dizziness and numbness. Negative for headaches.  Hematological:  Does not bruise/bleed easily.  Psychiatric/Behavioral:  Negative for depression, sleep disturbance and suicidal ideas. The patient is not nervous/anxious.   All other systems reviewed and are negative.    VITALS:   Blood pressure (!) 154/91, pulse 84, temperature 97.8 F (36.6 C), temperature source Oral, resp. rate 16, height  (1.651 m), weight 233 lb 4.8 oz (105.8 kg), SpO2 100 %.  Wt Readings from Last 3 Encounters:  06/28/22 233 lb 4.8 oz (105.8 kg)  06/21/22 230 lb (104.3 kg)    Body mass index is 38.82 kg/m.  Performance status (ECOG): 0 - Asymptomatic  PHYSICAL EXAM:   Physical Exam Vitals and nursing note reviewed. Exam conducted with a chaperone present.  Constitutional:      Appearance: Normal appearance.  Cardiovascular:     Rate and Rhythm: Normal rate and regular rhythm.     Pulses: Normal pulses.     Heart sounds: Normal heart sounds.  Pulmonary:     Effort: Pulmonary effort is normal.     Breath sounds: Normal breath sounds.  Abdominal:     Palpations: Abdomen is soft. There is no hepatomegaly, splenomegaly or  mass.     Tenderness: There is no abdominal tenderness.  Musculoskeletal:     Right lower leg: No edema.     Left lower leg: No edema.  Lymphadenopathy:     Cervical: No cervical adenopathy.     Right cervical: No superficial, deep or posterior cervical adenopathy.    Left cervical: No superficial, deep or posterior cervical adenopathy.     Upper Body:     Right upper body: No supraclavicular or axillary adenopathy.     Left upper body: No supraclavicular or axillary adenopathy.  Neurological:     General: No focal deficit present.     Mental Status: She is alert and oriented to person, place, and time.  Psychiatric:        Mood and Affect: Mood normal.        Behavior: Behavior normal.     LABS:      Latest Ref Rng & Units 06/21/2022    8:01 PM  CBC  WBC 4.0 - 10.5 K/uL 11.0   Hemoglobin 12.0 - 15.0 g/dL 16.1   Hematocrit 09.6 - 46.0 % 39.8   Platelets 150 - 400 K/uL 227       Latest Ref Rng & Units 06/21/2022    9:32 PM 06/21/2022    8:01 PM  CMP  Glucose 70 - 99 mg/dL  045   BUN 6 - 20 mg/dL  13   Creatinine 4.09 - 1.00 mg/dL  8.11   Sodium 914 - 782 mmol/L  134   Potassium 3.5 - 5.1 mmol/L  3.9   Chloride 98 - 111 mmol/L  100   CO2 22 - 32 mmol/L  25   Calcium 8.9 - 10.3 mg/dL  8.5   Total Protein 6.5 - 8.1 g/dL 7.7    Total Bilirubin 0.3 - 1.2 mg/dL 0.4    Alkaline Phos 38 - 126 U/L 64    AST 15 - 41 U/L 28    ALT 0 - 44 U/L 31       No results found for: "CEA1", "CEA" / No results found for: "CEA1", "CEA" No results found for: "PSA1" No results found for: "NFA213" No results found for: "CAN125"  No results found for: "TOTALPROTELP", "ALBUMINELP", "A1GS", "A2GS", "BETS", "BETA2SER", "GAMS", "MSPIKE", "SPEI" Lab Results  Component Value Date   TIBC 285 06/28/2022   FERRITIN 132 06/28/2022   IRONPCTSAT 13 06/28/2022   No results found for: "LDH"   STUDIES:   CT ABDOMEN PELVIS W CONTRAST  Result Date: 06/22/2022 CLINICAL DATA:  Seizure-like  episode, fever/chills EXAM: CT ABDOMEN AND PELVIS WITH CONTRAST TECHNIQUE: Multidetector CT imaging of the abdomen and pelvis was performed using the standard protocol following bolus administration of intravenous contrast. RADIATION DOSE REDUCTION: This exam was performed according to the departmental dose-optimization program which includes automated exposure control, adjustment of the mA and/or kV according to patient size and/or use of iterative reconstruction technique. CONTRAST:  OMNIPAQUE IOHEXOL 300 MG/ML  SOLN COMPARISON:  None Available. FINDINGS: Lower chest: Lung bases are clear. Hepatobiliary: Liver is within normal limits. Cholelithiasis, without associated inflammatory changes to suggest acute cholecystitis. No intrahepatic or extrahepatic ductal dilatation. Pancreas: Suspected 3.4 x 2.5 x 5.6 cm mass in the pancreatic head/uncinate process (series 2/image 35; sagittal image 59) concerning for primary pancreatic neoplasm. Associated atrophy of the pancreatic body/tail (series 2/image 29). Spleen: Within normal limits. Adrenals/Urinary Tract:  Adrenal glands are within normal limits. Punctate nonobstructing right lower pole renal calculus (series 2/image 52). Heterogeneous enhancement of the medial interpolar right kidney (series 2/image 47) with additional 2.5 cm vague lesion in the posterior right upper kidney (series 2/image 42), favored to be related to pyelonephritis given associated mild perinephric stranding/fluid (series 2/image 45). No hydronephrosis. Status post left nephrectomy. Bladder is notable for trace nondependent gas (series 2/image 36). Stomach/Bowel: Stomach is within normal limits. No evidence of bowel obstruction. Normal appendix (series 2/image 59). Sigmoid diverticulosis, without evidence of diverticulitis. Vascular/Lymphatic: No evidence of abdominal aortic aneurysm. No suspicious abdominopelvic lymphadenopathy. Reproductive: Mildly heterogeneous uterus, suggesting uterine  fibroids. Bilateral ovaries are within normal limits. Other: No abdominopelvic ascites. Large left lateral hernia containing multiple nondilated loops of small bowel (series 2/image 50), without fluid or inflammatory changes. Musculoskeletal: Visualized osseous structures are within normal limits. 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 Associated atrophy of the pancreatic body/tail. EUS is suggested for tissue confirmation. Heterogeneous enhancement of the right kidney, favored to be related to pyelonephritis. Additional ancillary findings as above. Electronically Signed   By: Charline Bills M.D.   On: 06/22/2022 02:19   CT Head Wo Contrast  Result Date: 06/21/2022 CLINICAL DATA:  Seizure-like episode EXAM: CT HEAD WITHOUT CONTRAST TECHNIQUE: Contiguous axial images were obtained from the base of the skull through the vertex without intravenous contrast. RADIATION DOSE REDUCTION: This exam was performed according to the departmental dose-optimization program which includes automated exposure control, adjustment of the mA and/or kV according to patient size and/or use of iterative reconstruction technique. COMPARISON:  None Available. FINDINGS: Brain: No evidence of acute infarction, hemorrhage, hydrocephalus, extra-axial collection or mass lesion/mass effect. Mild subcortical white matter and periventricular small vessel ischemic changes. Vascular: No hyperdense vessel or unexpected calcification. Skull: Normal. Negative for fracture or focal lesion. Sinuses/Orbits: The visualized paranasal sinuses are essentially clear. The mastoid air cells are unopacified. Other: None. IMPRESSION: No acute intracranial abnormality. Mild small vessel ischemic changes. Electronically Signed   By: Charline Bills M.D.   On: 06/21/2022 21:36   DG Chest 2 View  Result Date: 06/21/2022 CLINICAL DATA:  Chest pain EXAM: CHEST - 2 VIEW COMPARISON:   None Available. FINDINGS: Lungs are clear.  No pleural effusion or pneumothorax. The heart is normal in size. Visualized osseous structures are within normal limits. IMPRESSION: Normal chest radiographs. Electronically Signed   By: Charline Bills M.D.   On: 06/21/2022 20:17

## 2022-06-28 ENCOUNTER — Encounter: Payer: Self-pay | Admitting: Hematology

## 2022-06-28 ENCOUNTER — Inpatient Hospital Stay: Payer: Medicaid Other | Attending: Hematology | Admitting: Hematology

## 2022-06-28 ENCOUNTER — Other Ambulatory Visit: Payer: Self-pay

## 2022-06-28 ENCOUNTER — Inpatient Hospital Stay: Payer: Medicaid Other

## 2022-06-28 ENCOUNTER — Telehealth: Payer: Self-pay | Admitting: Gastroenterology

## 2022-06-28 VITALS — BP 154/91 | HR 84 | Temp 97.8°F | Resp 16 | Ht 65.0 in | Wt 233.3 lb

## 2022-06-28 DIAGNOSIS — Z79899 Other long term (current) drug therapy: Secondary | ICD-10-CM | POA: Insufficient documentation

## 2022-06-28 DIAGNOSIS — Z800 Family history of malignant neoplasm of digestive organs: Secondary | ICD-10-CM | POA: Diagnosis not present

## 2022-06-28 DIAGNOSIS — R200 Anesthesia of skin: Secondary | ICD-10-CM | POA: Insufficient documentation

## 2022-06-28 DIAGNOSIS — F17210 Nicotine dependence, cigarettes, uncomplicated: Secondary | ICD-10-CM | POA: Insufficient documentation

## 2022-06-28 DIAGNOSIS — K8689 Other specified diseases of pancreas: Secondary | ICD-10-CM | POA: Diagnosis present

## 2022-06-28 LAB — IRON AND TIBC
Iron: 38 ug/dL (ref 28–170)
Saturation Ratios: 13 % (ref 10.4–31.8)
TIBC: 285 ug/dL (ref 250–450)
UIBC: 247 ug/dL

## 2022-06-28 LAB — GENETIC SCREENING ORDER

## 2022-06-28 LAB — FERRITIN: Ferritin: 132 ng/mL (ref 11–307)

## 2022-06-28 NOTE — Telephone Encounter (Signed)
EUS has been scheduled for 07/09/22 at 915 am at [facility] with GM   Left message on machine to call back

## 2022-06-28 NOTE — Telephone Encounter (Signed)
Patty, place this patient on 5/6 which I see an open slot for. EUS for pancreatic mass rule out malignancy. Will fit the time that is available from [phone]. Thanks. GM

## 2022-06-28 NOTE — Telephone Encounter (Signed)
Dr. Meridee Score,  Urgent referral in [facility] for EUS to biopsy pancreatic mass.  Records in EPIC.  Please review and advise scheduling.  Thanks [employer]

## 2022-06-28 NOTE — Telephone Encounter (Signed)
Dr Mansouraty please review  

## 2022-06-28 NOTE — Patient Instructions (Addendum)
[facility]  Discharge Instructions  You were seen and examined today by Dr. Ellin Saba. Dr. Ellin Saba is a medical oncologist, meaning that he specializes in the treatment of cancer diagnoses. Dr. Ellin Saba discussed your past medical history, family history of cancers, and the events that led to you being here today.  You were referred to dr. Ellin Saba due to an abnormal CT scan which revealed a possible mass in your pancreas concerning for cancer.  Dr. Ellin Saba has recommended a PET scan. A PET scan is a specialized CT scan that scans from your ear lobes to your knee caps, and illuminates where there is cancer present in your body. Dr. Ellin Saba will also request a CT scan with a Pancreatic Protocol to see the mass clearly, this could be helpful to decipher treatment options, including surgery.  Dr. Ellin Saba will refer you to a GI doctor who can biopsy the mass via endoscopic ultrasound (EUS).  Dr. Ellin Saba will also draw genetic testing labs today to see if there is any genetic predisposition to cancer development. Dr. Ellin Saba will order a tumor marker as well that is specific for pancreatic cancer.  Dr. Ellin Saba would like to see you one week after biopsy/EUS.  Follow-up as scheduled.  Thank you for choosing [facility] to provide your oncology and hematology care.   To afford each patient quality time with our provider, please arrive at least 15 minutes before your scheduled appointment time. You may need to reschedule your appointment if you arrive late (10 or more minutes). Arriving late affects you and other patients whose appointments are after yours.  Also, if you miss three or more appointments without notifying the office, you may be dismissed from the clinic at the provider's discretion.    Again, thank you for choosing [facility].  Our hope is that these requests will decrease the amount of time  that you wait before being seen by our physicians.   If you have a lab appointment with the Cancer Center - please note that after April 8th, all labs will be drawn in the cancer center.  You do not have to check in or register with the main entrance as you have in the past but will complete your check-in at the cancer center.            _____________________________________________________________  Should you have questions after your visit to [facility], please contact our office at [phone] and follow the prompts.  Our office hours are 8:00 a.m. to 4:30 p.m. Monday - Thursday and 8:00 a.m. to 2:30 p.m. Friday.  Please note that voicemails left after 4:00 p.m. may not be returned until the following business day.  We are closed weekends and all major holidays.  You do have access to a nurse 24-7, just call the main number to the clinic [phone] and do not press any options, hold on the line and a nurse will answer the phone.    For prescription refill requests, have your pharmacy contact our office and allow 72 hours.    Masks are no longer required in the cancer centers. If you would like for your care team to wear a mask while they are taking care of you, please let them know. You may have one support person who is at least 61 years old accompany you for your appointments.

## 2022-06-29 NOTE — Telephone Encounter (Signed)
EUS scheduled, pt instructed and medications reviewed.  Patient instructions mailed to home and sent to My Chart .  Patient to call with any questions or concerns.  

## 2022-06-30 LAB — CANCER ANTIGEN 19-9: CA 19-9: 27 U/mL (ref 0–35)

## 2022-07-02 ENCOUNTER — Other Ambulatory Visit: Payer: Self-pay | Admitting: Hematology

## 2022-07-02 ENCOUNTER — Ambulatory Visit (HOSPITAL_COMMUNITY)
Admission: RE | Admit: 2022-07-02 | Discharge: 2022-07-02 | Disposition: A | Discharge status: Home / Self Care | Payer: Medicaid Other | Source: Ambulatory Visit | Attending: Hematology | Admitting: Hematology

## 2022-07-02 ENCOUNTER — Encounter (HOSPITAL_COMMUNITY): Payer: Self-pay | Admitting: Radiology

## 2022-07-02 DIAGNOSIS — K8689 Other specified diseases of pancreas: Secondary | ICD-10-CM | POA: Insufficient documentation

## 2022-07-02 MED ORDER — IOHEXOL 300 MG/ML  SOLN
80.0000 mL | Freq: Once | INTRAMUSCULAR | Status: AC | PRN
  Administered 2022-07-02: 80 mL via INTRAVENOUS

## 2022-07-04 ENCOUNTER — Telehealth: Payer: Self-pay | Admitting: Gastroenterology

## 2022-07-04 ENCOUNTER — Telehealth: Payer: Self-pay | Admitting: Licensed Clinical Social Worker

## 2022-07-04 NOTE — Telephone Encounter (Signed)
This needs to go to the prior auth team thank you

## 2022-07-04 NOTE — Telephone Encounter (Signed)
Sonya from [facility] Prior Auth needs to know why the PT needs to have this procedure done in the hospital as well as the clinical information. Fax to [phone] with reference # [id number] on cover sheet. Reasoning can also be written on cover sheet. Further questions, please call [phone]

## 2022-07-04 NOTE — Telephone Encounter (Signed)
Genetic testing order placed for [facility] Common Hereditary Cancers Panel + RNA.    Lacy Duverney, MS, [facility] Genetic Counselor [email or web address] Phone: [phone]

## 2022-07-05 ENCOUNTER — Encounter (HOSPITAL_COMMUNITY): Payer: Medicaid Other

## 2022-07-09 ENCOUNTER — Ambulatory Visit (HOSPITAL_BASED_OUTPATIENT_CLINIC_OR_DEPARTMENT_OTHER): Payer: Medicaid Other | Admitting: Certified Registered"

## 2022-07-09 ENCOUNTER — Encounter (HOSPITAL_COMMUNITY): Payer: Self-pay | Admitting: Gastroenterology

## 2022-07-09 ENCOUNTER — Other Ambulatory Visit: Payer: Self-pay

## 2022-07-09 ENCOUNTER — Encounter (HOSPITAL_COMMUNITY)
Admission: RE | Disposition: A | Discharge status: Home / Self Care | Payer: Self-pay | Source: Home / Self Care | Attending: Gastroenterology

## 2022-07-09 ENCOUNTER — Ambulatory Visit (HOSPITAL_COMMUNITY): Payer: Medicaid Other | Admitting: Certified Registered"

## 2022-07-09 ENCOUNTER — Ambulatory Visit (HOSPITAL_COMMUNITY)
Admission: RE | Admit: 2022-07-09 | Discharge: 2022-07-09 | Disposition: A | Discharge status: Home / Self Care | Payer: Medicaid Other | Attending: Gastroenterology | Admitting: Gastroenterology

## 2022-07-09 DIAGNOSIS — F17290 Nicotine dependence, other tobacco product, uncomplicated: Secondary | ICD-10-CM | POA: Insufficient documentation

## 2022-07-09 DIAGNOSIS — Z6839 Body mass index (BMI) 39.0-39.9, adult: Secondary | ICD-10-CM

## 2022-07-09 DIAGNOSIS — K8689 Other specified diseases of pancreas: Secondary | ICD-10-CM | POA: Diagnosis not present

## 2022-07-09 DIAGNOSIS — K2950 Unspecified chronic gastritis without bleeding: Secondary | ICD-10-CM | POA: Insufficient documentation

## 2022-07-09 DIAGNOSIS — K8020 Calculus of gallbladder without cholecystitis without obstruction: Secondary | ICD-10-CM | POA: Diagnosis not present

## 2022-07-09 DIAGNOSIS — K3189 Other diseases of stomach and duodenum: Secondary | ICD-10-CM

## 2022-07-09 DIAGNOSIS — K2289 Other specified disease of esophagus: Secondary | ICD-10-CM

## 2022-07-09 DIAGNOSIS — I899 Noninfective disorder of lymphatic vessels and lymph nodes, unspecified: Secondary | ICD-10-CM

## 2022-07-09 DIAGNOSIS — K859 Acute pancreatitis without necrosis or infection, unspecified: Secondary | ICD-10-CM | POA: Insufficient documentation

## 2022-07-09 DIAGNOSIS — F419 Anxiety disorder, unspecified: Secondary | ICD-10-CM

## 2022-07-09 DIAGNOSIS — B9681 Helicobacter pylori [H. pylori] as the cause of diseases classified elsewhere: Secondary | ICD-10-CM | POA: Diagnosis not present

## 2022-07-09 DIAGNOSIS — K869 Disease of pancreas, unspecified: Secondary | ICD-10-CM | POA: Diagnosis present

## 2022-07-09 HISTORY — PX: EUS: SHX5427

## 2022-07-09 HISTORY — PX: BIOPSY: SHX5522

## 2022-07-09 HISTORY — DX: Nausea with vomiting, unspecified: R11.2

## 2022-07-09 HISTORY — PX: ESOPHAGOGASTRODUODENOSCOPY: SHX5428

## 2022-07-09 HISTORY — DX: Nausea with vomiting, unspecified: Z98.890

## 2022-07-09 HISTORY — PX: FINE NEEDLE ASPIRATION: SHX5430

## 2022-07-09 SURGERY — UPPER ENDOSCOPIC ULTRASOUND (EUS) RADIAL
Anesthesia: Monitor Anesthesia Care

## 2022-07-09 MED ORDER — LACTATED RINGERS IV SOLN
INTRAVENOUS | Status: AC | PRN
  Administered 2022-07-09: 10 mL/h via INTRAVENOUS

## 2022-07-09 MED ORDER — PROPOFOL 500 MG/50ML IV EMUL
INTRAVENOUS | Status: DC | PRN
  Administered 2022-07-09: 150 ug/kg/min via INTRAVENOUS

## 2022-07-09 MED ORDER — DEXMEDETOMIDINE HCL IN NACL 80 MCG/20ML IV SOLN
INTRAVENOUS | Status: DC | PRN
  Administered 2022-07-09 (×5): 4 ug via INTRAVENOUS

## 2022-07-09 MED ORDER — PROPOFOL 10 MG/ML IV BOLUS
INTRAVENOUS | Status: DC | PRN
  Administered 2022-07-09 (×2): 30 mg via INTRAVENOUS
  Administered 2022-07-09 (×2): 20 mg via INTRAVENOUS

## 2022-07-09 MED ORDER — LIDOCAINE HCL (CARDIAC) PF 100 MG/5ML IV SOSY
PREFILLED_SYRINGE | INTRAVENOUS | Status: DC | PRN
  Administered 2022-07-09: 60 mg via INTRAVENOUS
  Administered 2022-07-09: 40 mg via INTRAVENOUS

## 2022-07-09 MED ORDER — GLYCOPYRROLATE 0.2 MG/ML IJ SOLN
INTRAMUSCULAR | Status: DC | PRN
  Administered 2022-07-09: .1 mg via INTRAVENOUS

## 2022-07-09 MED ORDER — SODIUM CHLORIDE 0.9 % IV SOLN: INTRAVENOUS | Status: DC

## 2022-07-09 NOTE — Op Note (Signed)
[facility] Patient Name: Alexis Cain Procedure Date: 07/09/2022 MRN: [id number] Attending MD: Corliss Parish , MD, [id number] Date of Birth: [birth year] CSN: [id number] Age: 62 Admit Type: Outpatient Procedure:                Upper EUS Indications:              Suspected mass in pancreas on CT scan (normal CA                            19-9) Providers:                Corliss Parish, MD, Norman Clay, RN, Harrington Challenger,                            Technician Referring MD:             [facility], Doreatha Massed, MD Medicines:                Monitored Anesthesia Care Complications:            No immediate complications. Estimated Blood Loss:     Estimated blood loss was minimal. Procedure:                Pre-Anesthesia Assessment:                           - Prior to the procedure, a History and Physical                            was performed, and patient medications and                            allergies were reviewed. The patient's tolerance of                            previous anesthesia was also reviewed. The risks                            and benefits of the procedure and the sedation                            options and risks were discussed with the patient.                            All questions were answered, and informed consent                            was obtained. Prior Anticoagulants: The patient has                            taken no anticoagulant or antiplatelet agents. ASA                            Grade Assessment: II - A patient with mild systemic  disease. After reviewing the risks and benefits,                            the patient was deemed in satisfactory condition to                            undergo the procedure.                           After obtaining informed consent, the endoscope was                            passed under direct vision. Throughout the                             procedure, the patient's blood pressure, pulse, and                            oxygen saturations were monitored continuously. The                            [id number]-[id number] ([id number]) Olympus endoscope was introduced                            through the mouth, and advanced to the second part                            of duodenum. The [id number] ([id number]) Olympus                            duodenoscope was introduced through the mouth, and                            advanced to the area of papilla. The Linear GF-                            [id number] ) was introduced through the                            mouth, and advanced to the duodenum for ultrasound                            examination from the stomach and duodenum. The                            upper EUS was technically difficult and complex.                            Successful completion of the procedure was aided by                            performing the maneuvers documented (below) in this  report. The patient tolerated the procedure. Scope In: Scope Out: Findings:      ENDOSCOPIC FINDING: :      No gross lesions were noted in the entire esophagus.      The Z-line was irregular and was found 37 cm from the incisors.      Patchy mildly erythematous mucosa without bleeding was found in the       entire examined stomach. Biopsies were taken with a cold forceps for       histology and Helicobacter pylori testing.      No gross lesions were noted in the duodenal bulb, in the first portion       of the duodenum and in the second portion of the duodenum.      The major papilla was normal.      ENDOSONOGRAPHIC FINDING: :      The pancreatic duct had a regular endosonographic appearance in the       pancreatic head (2.5 mm), genu of the pancreas (2.4 mm), body of the       pancreas (2.9 mm) and tail of the pancreas (1.0 mm).      Pancreatic parenchymal abnormalities were noted in the genu of the        pancreas, pancreatic body and pancreatic tail. These consisted of       atrophy as well as hyperechoic stranding.      It was a very difficult to visualize the uncinate process as a result of       the angulation and short duodenal sweep. I kept having loss of stability       moving back into the stomach with very small movements. After       significant time and with assistance of holding the scope in position, I       did visualize what appeared to be an area of irregularity within the       uncinate process of the pancreas. The area was was hypoechoic compared       to the rest of the pancreas but not as significant as would be expected       from a true malignancy. This area in cross-section measured 30 mm by 20       mm in maximal cross-sectional diameter. The outer margins were       irregular. An intact interface was seen between the area and the       superior mesenteric artery and celiac trunk suggesting a lack of       invasion. Fine needle biopsy was performed in 2 different regions of the       uncinate process. Color Doppler imaging was utilized prior to needle       puncture to confirm a lack of significant vascular structures within the       needle path. Seven passes were made with the 22 gauge Acquire biopsy       needle using a transduodenal approach. Visible cores of tissue were       obtained. Preliminary cytologic examination and touch preps were       performed. Final cytology results are pending.      There was no sign of significant endosonographic abnormality in the       common bile duct (3.4 mm) and in the common hepatic duct (7.0 mm). No       evidence of choledocholithiasis.      Stones were visualized endosonographically in the gallbladder. They  were       hyperechoic and characterized by shadowing.      Endosonographic imaging of the ampulla showed no mass.      No malignant-appearing lymph nodes were visualized in the celiac region       (level 20),  peripancreatic region and porta hepatis region.      Endosonographic imaging in the visualized portion of the liver showed no       mass.      The celiac region was visualized. Impression:               EGD impression:                           - No gross lesions in the entire esophagus. Z-line                            irregular, 37 cm from the incisors.                           - Erythematous mucosa in the stomach. Biopsied.                           - No gross lesions in the duodenal bulb, in the                            first portion of the duodenum and in the second                            portion of the duodenum.                           - Normal major papilla.                           EUS impression:                           - The pancreatic duct had a regular endosonographic                            appearance in the pancreatic head, genu of the                            pancreas, body of the pancreas and tail of the                            pancreas.                           - Pancreatic parenchymal abnormalities consisting                            of atrophy as well as hyperechoic stranding were                            noted in the genu of the pancreas,  pancreatic body                            and pancreatic tail.                           - An area of irregularity, masslike in appearance                            was felt to be identified in the uncinate process                            of the pancreas. Cytology results are pending.                            However, the endosonographic appearance could be                            suspicious for adenocarcinoma it was not the most                            significant as compared to other masses. Focal                            pancreatitis could also be playing a role at this                            time. This was staged T2 N0 Mx by endosonographic                            criteria if  malignancy is confirmed. Fine needle                            biopsy performed as outlined above.                           - There was no sign of significant pathology in the                            common bile duct and in the common hepatic duct.                           - Stones were visualized endosonographically in the                            gallbladder.                           - No malignant-appearing lymph nodes were                            visualized in the celiac region (level 20),  peripancreatic region and porta hepatis region. Moderate Sedation:      Not Applicable - Patient had care per Anesthesia. Recommendation:           - The patient will be observed post-procedure,                            until all discharge criteria are met.                           - Discharge patient to home.                           - Patient has a contact number available for                            emergencies. The signs and symptoms of potential                            delayed complications were discussed with the                            patient. Return to normal activities tomorrow.                            Written discharge instructions were provided to the                            patient.                           - Low fat diet for 1 week.                           - Observe patient's clinical course.                           - Await cytology results and await path results.                           - Continue present medications.                           - Monitor for signs/symptoms of bleeding,                            perforation, and infection. If issues please call                            our number to get further assistance as needed.                           - Follow-up IgG4 that was drawn today.                           - I am not convinced endosonographically that we  are visualizing the  reported abnormality but                            certainly the area that we sampled today was                            irregular. Focal pancreatitis could still be a                            possibility. Will see what the IgG4 level shows in                            regards to the potential for autoimmune                            pancreatitis. If the sampling is unremarkable, then                            I would recommend a MRI abdomen/MRCP abdomen to be                            performed in the next 3 to 4 weeks and then                            potential repeat EUS attempt if there still remains                            concern of a mass. Will discuss this further with                            referring provider once the cytology returns.                           - The findings and recommendations were discussed                            with the patient.                           - The findings and recommendations were discussed                            with the patient's family. Procedure Code(s):        --- Professional ---                           [id number], Esophagogastroduodenoscopy, flexible,                            transoral; with transendoscopic ultrasound-guided                            intramural or transmural fine needle  aspiration/biopsy(s), (includes endoscopic                            ultrasound examination limited to the esophagus,                            stomach or duodenum, and adjacent structures)                           43239, 59, Esophagogastroduodenoscopy, flexible,                            transoral; with biopsy, single or multiple Diagnosis Code(s):        --- Professional ---                           K22.89, Other specified disease of esophagus                           K31.89, Other diseases of stomach and duodenum                           K86.9, Disease of pancreas, unspecified                            K86.89, Other specified diseases of pancreas                           K80.20, Calculus of gallbladder without                            cholecystitis without obstruction                           I89.9, Noninfective disorder of lymphatic vessels                            and lymph nodes, unspecified                           R93.3, Abnormal findings on diagnostic imaging of                            other parts of digestive tract CPT copyright 2022 [employer]. All rights reserved. The codes documented in this report are preliminary and upon coder review may  be revised to meet current compliance requirements. Corliss Parish, MD 07/09/2022 10:21:50 AM Number of Addenda: 0

## 2022-07-09 NOTE — Anesthesia Preprocedure Evaluation (Addendum)
Anesthesia Evaluation  Patient identified by MRN, date of birth, ID band Patient awake    Reviewed: Allergy & Precautions, NPO status , Patient's Chart, lab work & pertinent test results  History of Anesthesia Complications (+) PONV and history of anesthetic complications  Airway Mallampati: II  TM Distance: >3 FB Neck ROM: Full    Dental  (+) Chipped, Dental Advisory Given,    Pulmonary Current Smoker and Patient abstained from smoking.   Pulmonary exam normal breath sounds clear to auscultation       Cardiovascular negative cardio ROS Normal cardiovascular exam Rhythm:Regular Rate:Normal     Neuro/Psych  PSYCHIATRIC DISORDERS Anxiety     negative neurological ROS     GI/Hepatic negative GI ROS, Neg liver ROS,,,  Endo/Other    Morbid obesity (BMI 40)  Renal/GU negative Renal ROS  negative genitourinary   Musculoskeletal  (+) Arthritis ,    Abdominal   Peds  Hematology negative hematology ROS (+)   Anesthesia Other Findings   Reproductive/Obstetrics                             Anesthesia Physical Anesthesia Plan  ASA: 2  Anesthesia Plan: MAC   Post-op Pain Management:    Induction: Intravenous  PONV Risk Score and Plan: 2 and Propofol infusion and Treatment may vary due to age or medical condition  Airway Management Planned: Natural Airway  Additional Equipment:   Intra-op Plan:   Post-operative Plan:   Informed Consent: I have reviewed the patients History and Physical, chart, labs and discussed the procedure including the risks, benefits and alternatives for the proposed anesthesia with the patient or authorized representative who has indicated his/her understanding and acceptance.     Dental advisory given  Plan Discussed with: CRNA  Anesthesia Plan Comments:        Anesthesia Quick Evaluation

## 2022-07-09 NOTE — Transfer of Care (Signed)
Immediate Anesthesia Transfer of Care Note  Patient: Keymoni O Grumbine  Procedure(s) Performed: UPPER ENDOSCOPIC ULTRASOUND (EUS) RADIAL BIOPSY ESOPHAGOGASTRODUODENOSCOPY (EGD) FINE NEEDLE ASPIRATION (FNA) LINEAR  Patient Location: PACU and Endoscopy Unit  Anesthesia Type:MAC  Level of Consciousness: drowsy  Airway & Oxygen Therapy: Patient Spontanous Breathing and Patient connected to face mask oxygen  Post-op Assessment: Report given to RN and Post -op Vital signs reviewed and stable  Post vital signs: Reviewed and stable  Last Vitals:  Vitals Value Taken Time  BP 100/45 07/09/22 1006  Temp    Pulse 64 07/09/22 1006  Resp 14 07/09/22 1006  SpO2 100 % 07/09/22 1006  Vitals shown include unvalidated device data.  Last Pain:  Vitals:   07/09/22 1006  TempSrc: Temporal  PainSc: Asleep         Complications: No notable events documented.

## 2022-07-09 NOTE — Discharge Instructions (Signed)
YOU HAD AN ENDOSCOPIC PROCEDURE TODAY: Refer to the procedure report and other information in the discharge instructions given to you for any specific questions about what was found during the examination. If this information does not answer your questions, please call [place] office at [phone] to clarify.  ° °YOU SHOULD EXPECT: Some feelings of bloating in the abdomen. Passage of more gas than usual. Walking can help get rid of the air that was put into your GI tract during the procedure and reduce the bloating. If you had a lower endoscopy (such as a colonoscopy or flexible sigmoidoscopy) you may notice spotting of blood in your stool or on the toilet paper. Some abdominal soreness may be present for a day or two, also. ° °DIET: Your first meal following the procedure should be a light meal and then it is ok to progress to your normal diet. A half-sandwich or bowl of soup is an example of a good first meal. Heavy or fried foods are harder to digest and may make you feel nauseous or bloated. Drink plenty of fluids but you should avoid alcoholic beverages for 24 hours. If you had a esophageal dilation, please see attached instructions for diet.   ° °ACTIVITY: Your care partner should take you home directly after the procedure. You should plan to take it easy, moving slowly for the rest of the day. You can resume normal activity the day after the procedure however YOU SHOULD NOT DRIVE, use power tools, machinery or perform tasks that involve climbing or major physical exertion for 24 hours (because of the sedation medicines used during the test).  ° °SYMPTOMS TO REPORT IMMEDIATELY: °A gastroenterologist can be reached at any hour. Please call [phone]  for any of the following symptoms:  °• Following lower endoscopy (colonoscopy, flexible sigmoidoscopy) °Excessive amounts of blood in the stool  °Significant tenderness, worsening of abdominal pains  °Swelling of the abdomen that is new, acute  °Fever of 100°  or higher  °• Following upper endoscopy (EGD, EUS, ERCP, esophageal dilation) °Vomiting of blood or coffee ground material  °New, significant abdominal pain  °New, significant chest pain or pain under the shoulder blades  °Painful or persistently difficult swallowing  °New shortness of breath  °Black, tarry-looking or red, bloody stools ° °FOLLOW UP:  °If any biopsies were taken you will be contacted by phone or by letter within the next 1-3 weeks. Call [phone]  if you have not heard about the biopsies in 3 weeks.  °Please also call with any specific questions about appointments or follow up tests.YOU HAD AN ENDOSCOPIC PROCEDURE TODAY: Refer to the procedure report and other information in the discharge instructions given to you for any specific questions about what was found during the examination. If this information does not answer your questions, please call [place] office at [phone] to clarify.  ° °YOU SHOULD EXPECT: Some feelings of bloating in the abdomen. Passage of more gas than usual. Walking can help get rid of the air that was put into your GI tract during the procedure and reduce the bloating. If you had a lower endoscopy (such as a colonoscopy or flexible sigmoidoscopy) you may notice spotting of blood in your stool or on the toilet paper. Some abdominal soreness may be present for a day or two, also. ° °DIET: Your first meal following the procedure should be a light meal and then it is ok to progress to your normal diet. A half-sandwich or bowl of soup is an example   of a good first meal. Heavy or fried foods are harder to digest and may make you feel nauseous or bloated. Drink plenty of fluids but you should avoid alcoholic beverages for 24 hours. If you had a esophageal dilation, please see attached instructions for diet.   ° °ACTIVITY: Your care partner should take you home directly after the procedure. You should plan to take it easy, moving slowly for the rest of the day. You can resume  normal activity the day after the procedure however YOU SHOULD NOT DRIVE, use power tools, machinery or perform tasks that involve climbing or major physical exertion for 24 hours (because of the sedation medicines used during the test).  ° °SYMPTOMS TO REPORT IMMEDIATELY: °A gastroenterologist can be reached at any hour. Please call [phone]  for any of the following symptoms:  °• Following upper endoscopy (EGD, EUS, ERCP, esophageal dilation) °Vomiting of blood or coffee ground material  °New, significant abdominal pain  °New, significant chest pain or pain under the shoulder blades  °Painful or persistently difficult swallowing  °New shortness of breath  °Black, tarry-looking or red, bloody stools ° °FOLLOW UP:  °If any biopsies were taken you will be contacted by phone or by letter within the next 1-3 weeks. Call [phone]  if you have not heard about the biopsies in 3 weeks.  °Please also call with any specific questions about appointments or follow up tests. °

## 2022-07-09 NOTE — H&P (Signed)
GASTROENTEROLOGY PROCEDURE H&P NOTE   Primary Care Physician: [facility]  HPI: Alexis Cain is a 62 y.o. female who presents for EGD/EUS to evaluate pancreatic mass in the head/uncinate rule out malignancy.  Past Medical History:  Diagnosis Date   Anxiety    Arthritis    PONV (postoperative nausea and vomiting)    Renal disorder    Past Surgical History:  Procedure Laterality Date   HERNIA REPAIR     KIDNEY SURGERY Left    Current Facility-Administered Medications  Medication Dose Route Frequency Provider Last Rate Last Admin   0.9 %  sodium chloride infusion   Intravenous Continuous Mansouraty, Netty Starring., MD       lactated ringers infusion    Continuous PRN Mansouraty, Netty Starring., MD 10 mL/hr at 07/09/22 0800 10 mL/hr at 07/09/22 0800    Current Facility-Administered Medications:    0.9 %  sodium chloride infusion, , Intravenous, Continuous, Mansouraty, Netty Starring., MD   lactated ringers infusion, , , Continuous PRN, Mansouraty, Netty Starring., MD, Last Rate: 10 mL/hr at 07/09/22 0800, 10 mL/hr at 07/09/22 0800 Allergies  Allergen Reactions   Sulfa Antibiotics Anaphylaxis   History reviewed. No pertinent family history. Social History   Socioeconomic History   Marital status: Married    Spouse name: Not on file   Number of children: Not on file   Years of education: Not on file   Highest education level: Not on file  Occupational History   Not on file  Tobacco Use   Smoking status: Every Day    Types: E-cigarettes   Smokeless tobacco: Current  Vaping Use   Vaping Use: Every day  Substance and Sexual Activity   Alcohol use: Not Currently    Comment: socially   Drug use: Never   Sexual activity: Yes  Other Topics Concern   Not on file  Social History Narrative   Not on file   Social Determinants of Health   Financial Resource Strain: Not on file  Food Insecurity: No Food Insecurity (06/28/2022)   Hunger Vital Sign    Worried About  Running Out of Food in the Last Year: Never true    Ran Out of Food in the Last Year: Never true  Transportation Needs: No Transportation Needs (06/28/2022)   PRAPARE - Administrator, Civil Service (Medical): No    Lack of Transportation (Non-Medical): No  Physical Activity: Not on file  Stress: Not on file  Social Connections: Not on file  Intimate Partner Violence: Not At Risk (06/28/2022)   Humiliation, Afraid, Rape, and Kick questionnaire    Fear of Current or Ex-Partner: No    Emotionally Abused: No    Physically Abused: No    Sexually Abused: No    Physical Exam: Today's Vitals   07/09/22 0752  BP: (!) 159/82  Pulse: 71  Resp: 18  Temp: (!) 97.2 F (36.2 C)  TempSrc: Temporal  SpO2: 98%  Weight: 106.6 kg  Height: 5\' 5"  (1.651 m)  PainSc: 0-No pain   Body mass index is 39.11 kg/m. GEN: NAD EYE: Sclerae anicteric ENT: MMM CV: Non-tachycardic GI: Soft, NT/ND NEURO:  Alert & Oriented x 3  Lab Results: No results for input(s): "WBC", "HGB", "HCT", "PLT" in the last 72 hours. BMET No results for input(s): "NA", "K", "CL", "CO2", "GLUCOSE", "BUN", "CREATININE", "CALCIUM" in the last 72 hours. LFT No results for input(s): "PROT", "ALBUMIN", "AST", "ALT", "ALKPHOS", "BILITOT", "BILIDIR", "IBILI" in the last  72 hours. PT/INR No results for input(s): "LABPROT", "INR" in the last 72 hours.   Impression / Plan: This is a 62 y.o.female  who presents for EGD/EUS to evaluate pancreatic mass in the head/uncinate rule out malignancy.  The risks of an EUS including intestinal perforation, bleeding, infection, aspiration, and medication effects were discussed as was the possibility it may not give a definitive diagnosis if a biopsy is performed.  When a biopsy of the pancreas is done as part of the EUS, there is an additional risk of pancreatitis at the rate of about 1-2%.  It was explained that procedure related pancreatitis is typically mild, although it can be  severe and even life threatening, which is why we do not perform random pancreatic biopsies and only biopsy a lesion/area we feel is concerning enough to warrant the risk.  The risks and benefits of endoscopic evaluation/treatment were discussed with the patient and/or family; these include but are not limited to the risk of perforation, infection, bleeding, missed lesions, lack of diagnosis, severe illness requiring hospitalization, as well as anesthesia and sedation related illnesses.  The patient's history has been reviewed, patient examined, no change in status, and deemed stable for procedure.  The patient and/or family is agreeable to proceed.    Corliss Parish, MD [facility] Office # [phone]

## 2022-07-09 NOTE — Anesthesia Postprocedure Evaluation (Signed)
Anesthesia Post Note  Patient: Maddux O Riese  Procedure(s) Performed: UPPER ENDOSCOPIC ULTRASOUND (EUS) RADIAL BIOPSY ESOPHAGOGASTRODUODENOSCOPY (EGD) FINE NEEDLE ASPIRATION (FNA) LINEAR     Patient location during evaluation: Endoscopy Anesthesia Type: MAC Level of consciousness: awake and alert Pain management: pain level controlled Vital Signs Assessment: post-procedure vital signs reviewed and stable Respiratory status: spontaneous breathing, nonlabored ventilation, respiratory function stable and patient connected to nasal cannula oxygen Cardiovascular status: blood pressure returned to baseline and stable Postop Assessment: no apparent nausea or vomiting Anesthetic complications: no  No notable events documented.  Last Vitals:  Vitals:   07/09/22 1020 07/09/22 1030  BP: (!) 110/59 117/69  Pulse: 66 (!) 59  Resp: 14 15  Temp:    SpO2: 98% 99%    Last Pain:  Vitals:   07/09/22 1030  TempSrc:   PainSc: 0-No pain                 Retaj Hilbun L Addis Tuohy

## 2022-07-10 ENCOUNTER — Encounter: Payer: Self-pay | Admitting: Gastroenterology

## 2022-07-10 LAB — SURGICAL PATHOLOGY

## 2022-07-11 ENCOUNTER — Other Ambulatory Visit: Payer: Self-pay

## 2022-07-11 DIAGNOSIS — A048 Other specified bacterial intestinal infections: Secondary | ICD-10-CM

## 2022-07-11 LAB — CYTOLOGY - NON PAP

## 2022-07-11 MED ORDER — BISMUTH/METRONIDAZ/TETRACYCLIN 140-125-125 MG PO CAPS: 3.0000 | ORAL_CAPSULE | Freq: Four times a day (QID) | ORAL | 0 refills | Status: DC

## 2022-07-11 MED ORDER — OMEPRAZOLE 20 MG PO CPDR: DELAYED_RELEASE_CAPSULE | ORAL | 0 refills | Status: DC

## 2022-07-12 ENCOUNTER — Encounter (HOSPITAL_COMMUNITY): Payer: Self-pay | Admitting: Gastroenterology

## 2022-07-12 LAB — IGG 4: IgG, Subclass 4: 3 mg/dL (ref 2–96)

## 2022-07-13 ENCOUNTER — Other Ambulatory Visit: Payer: Self-pay

## 2022-07-13 DIAGNOSIS — K8689 Other specified diseases of pancreas: Secondary | ICD-10-CM

## 2022-07-19 ENCOUNTER — Encounter (HOSPITAL_COMMUNITY): Admission: RE | Admit: 2022-07-19 | Payer: Medicaid Other | Source: Ambulatory Visit

## 2022-07-25 NOTE — Progress Notes (Signed)
[facility] [address], [place] [address]    Clinic Day:  07/26/2022  Referring physician: [facility]  Patient Care Team: [facility] as PCP - General Doreatha Massed, MD as Medical Oncologist (Medical Oncology) Therese Sarah, RN as Oncology Nurse Navigator (Medical Oncology)   ASSESSMENT & PLAN:   Assessment: 1.  Pancreatic head mass: - Patient went to the ER with symptoms of right-sided pyelonephritis. - CTAP (06/22/2022): 5.6 cm mass in the pancreatic head/uncinate process concerning for primary pancreatic neoplasm.  Associated atrophy of the pancreatic body/tail. - She denies any weight loss or abdominal pain.  Currently on antibiotics cephalexin 500 4 times daily for pyelonephritis. - She has numbness in the feet only when the feet swell after prolonged standing.   2.  Social/family history: - Lives at home with her husband.  Currently not working.  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  She also form tobacco many years ago.  She currently vapes for the past 10 years.  She smoked 1 pack of cigarettes per day for 20 years prior to vaping. - Father had cholangiocarcinoma.  Brother had throat cancer and was smoker.  Paternal uncle had lung cancer.  2 maternal aunts had cervical cancer.    Plan: 1.  Pancreatic head mass: - CA 19-9 level was normal at 27. - CT pancreatic protocol on 07/02/2022 reviewed by me: 4 cm solid masslike density in the pancreatic head abutting the SMV and proximal main portal vein but does not show encasement of either vessel.  No evidence of arterial involvement.  No evidence of abdominal metastatic disease. - Reviewed FNA of the pancreatic mass: No malignant cells. - Stomach biopsy was consistent with H. pylori.  She was treated with antibiotics. - She recollects having an episode of pancreatitis in her 10s. - Her insurance has denied PET scan.  I will reappeal. - She has a  follow-up appointment with Dr. Meridee Score for repeat EUS and possible biopsy.  Per patient, Dr. Meridee Score would cancel it if PET scan is negative. - I have also recommended Dr. Freida Busman consultation. - RTC 4 to 5 weeks for follow-up.    No orders of the defined types were placed in this encounter.     I,Katie Daubenspeck,acting as a Neurosurgeon for Doreatha Massed, MD.,have documented all relevant documentation on the behalf of Doreatha Massed, MD,as directed by  Doreatha Massed, MD while in the presence of Doreatha Massed, MD.   I, Doreatha Massed MD, have reviewed the above documentation for accuracy and completeness, and I agree with the above.   Doreatha Massed, MD   5/23/20246:28 PM  CHIEF COMPLAINT:   Diagnosis: pancreatic head mass    Cancer Staging  No matching staging information was found for the patient.   Prior Therapy: none  Current Therapy:  under work up   HISTORY OF PRESENT ILLNESS:   Oncology History   No history exists.     INTERVAL HISTORY:   Alexis Cain is a 62 y.o. female presenting to clinic today for follow up of pancreatic head mass. She was last seen by me on 06/28/22 in consultation.  Staging PET scan has not been performed due to her insurance. She did undergo staging CT abdomen with/without contrast on 07/02/22 showing: 4 cm solid mass-like density in pancreatic head, which abuts superior mesenteric vein and proximal main portal vein without encasement of either; no evidence of abdominal metastatic disease.  She also underwent EUS on 07/09/22  with Dr. Meridee Score. Per Dr. Elesa Hacker note, there was some difficulty in visualizing the uncinate process due to angulation and short duodenal sweep. He identified an area of irregularity and obtained a sample from the area. Cytology from the area showed only pancreatitis.  She is tentatively scheduled for repeat EUS on 08/20/22 pending PET scan results, once that is able to be  obtained.  Today, she states that she is doing well overall. Her appetite level is at 70%. Her energy level is at 53%.  PAST MEDICAL HISTORY:   Past Medical History: Past Medical History:  Diagnosis Date   Anxiety    Arthritis    PONV (postoperative nausea and vomiting)    Renal disorder     Surgical History: Past Surgical History:  Procedure Laterality Date   BIOPSY  07/09/2022   Procedure: BIOPSY;  Surgeon: Lemar Lofty., MD;  Location: [facility];  Service: Gastroenterology;;   ESOPHAGOGASTRODUODENOSCOPY N/A 07/09/2022   Procedure: ESOPHAGOGASTRODUODENOSCOPY (EGD);  Surgeon: Lemar Lofty., MD;  Location: [facility];  Service: Gastroenterology;  Laterality: N/A;   EUS N/A 07/09/2022   Procedure: UPPER ENDOSCOPIC ULTRASOUND (EUS) RADIAL;  Surgeon: Lemar Lofty., MD;  Location: [facility];  Service: Gastroenterology;  Laterality: N/A;   FINE NEEDLE ASPIRATION N/A 07/09/2022   Procedure: FINE NEEDLE ASPIRATION (FNA) LINEAR;  Surgeon: Lemar Lofty., MD;  Location: [facility];  Service: Gastroenterology;  Laterality: N/A;   HERNIA REPAIR     KIDNEY SURGERY Left     Social History: Social History   Socioeconomic History   Marital status: Married    Spouse name: Not on file   Number of children: Not on file   Years of education: Not on file   Highest education level: Not on file  Occupational History   Not on file  Tobacco Use   Smoking status: Every Day    Types: E-cigarettes   Smokeless tobacco: Current  Vaping Use   Vaping Use: Every day  Substance and Sexual Activity   Alcohol use: Not Currently    Comment: socially   Drug use: Never   Sexual activity: Yes  Other Topics Concern   Not on file  Social History Narrative   Not on file   Social Determinants of Health   Financial Resource Strain: Not on file  Food Insecurity: No Food Insecurity (06/28/2022)   Hunger Vital Sign    Worried About Running Out of Food in the  Last Year: Never true    Ran Out of Food in the Last Year: Never true  Transportation Needs: No Transportation Needs (06/28/2022)   PRAPARE - Administrator, Civil Service (Medical): No    Lack of Transportation (Non-Medical): No  Physical Activity: Not on file  Stress: Not on file  Social Connections: Not on file  Intimate Partner Violence: Not At Risk (06/28/2022)   Humiliation, Afraid, Rape, and Kick questionnaire    Fear of Current or Ex-Partner: No    Emotionally Abused: No    Physically Abused: No    Sexually Abused: No    Family History: No family history on file.  Current Medications:  Current Outpatient Medications:    albuterol (VENTOLIN HFA) 108 (90 Base) MCG/ACT inhaler, , Disp: , Rfl:    Cholecalciferol (VITAMIN D3) 25 MCG (1000 UT) CAPS, Take 2,000 Units by mouth daily., Disp: , Rfl:    citalopram (CELEXA) 20 MG tablet, Take 30 mg by mouth daily., Disp: , Rfl:    Cyanocobalamin (VITAMIN  B-12 CR PO), Take by mouth., Disp: , Rfl:    omeprazole (PRILOSEC) 20 MG capsule, Take 1 capsule twice daily for 10 days, then 1 daily for 1 month, Disp: 60 capsule, Rfl: 0   Turmeric (QC TUMERIC COMPLEX) 500 MG CAPS, Take 2 tablets by mouth daily., Disp: , Rfl:    Allergies: Allergies  Allergen Reactions   Sulfa Antibiotics Anaphylaxis    REVIEW OF SYSTEMS:   Review of Systems  Constitutional:  Negative for chills, fatigue and fever.  HENT:   Negative for lump/mass, mouth sores, nosebleeds, sore throat and trouble swallowing.   Eyes:  Negative for eye problems.  Respiratory:  Negative for cough and shortness of breath.   Cardiovascular:  Negative for chest pain, leg swelling and palpitations.  Gastrointestinal:  Positive for nausea. Negative for abdominal pain, constipation, diarrhea and vomiting.  Genitourinary:  Negative for bladder incontinence, difficulty urinating, dysuria, frequency, hematuria and nocturia.   Musculoskeletal:  Negative for arthralgias, back  pain, flank pain, myalgias and neck pain.  Skin:  Negative for itching and rash.  Neurological:  Positive for dizziness. Negative for headaches and numbness.  Hematological:  Does not bruise/bleed easily.  Psychiatric/Behavioral:  Negative for depression, sleep disturbance and suicidal ideas. The patient is not nervous/anxious.   All other systems reviewed and are negative.    VITALS:   Blood pressure (!) 158/88, pulse 76, temperature 98.1 F (36.7 C), temperature source Oral, resp. rate 16, weight 231 lb 6.4 oz (105 kg), SpO2 99 %.  Wt Readings from Last 3 Encounters:  07/26/22 231 lb 6.4 oz (105 kg)  07/09/22 235 lb (106.6 kg)  06/28/22 233 lb 4.8 oz (105.8 kg)    Body mass index is 38.51 kg/m.  Performance status (ECOG): 1 - Symptomatic but completely ambulatory  PHYSICAL EXAM:   Physical Exam Vitals and nursing note reviewed. Exam conducted with a chaperone present.  Constitutional:      Appearance: Normal appearance.  Cardiovascular:     Rate and Rhythm: Normal rate and regular rhythm.     Pulses: Normal pulses.     Heart sounds: Normal heart sounds.  Pulmonary:     Effort: Pulmonary effort is normal.     Breath sounds: Normal breath sounds.  Abdominal:     Palpations: Abdomen is soft. There is no hepatomegaly, splenomegaly or mass.     Tenderness: There is no abdominal tenderness.  Musculoskeletal:     Right lower leg: No edema.     Left lower leg: No edema.  Lymphadenopathy:     Cervical: No cervical adenopathy.     Right cervical: No superficial, deep or posterior cervical adenopathy.    Left cervical: No superficial, deep or posterior cervical adenopathy.     Upper Body:     Right upper body: No supraclavicular or axillary adenopathy.     Left upper body: No supraclavicular or axillary adenopathy.  Neurological:     General: No focal deficit present.     Mental Status: She is alert and oriented to person, place, and time.  Psychiatric:        Mood and  Affect: Mood normal.        Behavior: Behavior normal.     LABS:      Latest Ref Rng & Units 06/21/2022    8:01 PM  CBC  WBC 4.0 - 10.5 K/uL 11.0   Hemoglobin 12.0 - 15.0 g/dL 16.1   Hematocrit 09.6 - 46.0 % 39.8   Platelets 150 -  400 K/uL 227       Latest Ref Rng & Units 06/21/2022    9:32 PM 06/21/2022    8:01 PM  CMP  Glucose 70 - 99 mg/dL  161   BUN 6 - 20 mg/dL  13   Creatinine 0.96 - 1.00 mg/dL  0.45   Sodium 409 - 811 mmol/L  134   Potassium 3.5 - 5.1 mmol/L  3.9   Chloride 98 - 111 mmol/L  100   CO2 22 - 32 mmol/L  25   Calcium 8.9 - 10.3 mg/dL  8.5   Total Protein 6.5 - 8.1 g/dL 7.7    Total Bilirubin 0.3 - 1.2 mg/dL 0.4    Alkaline Phos 38 - 126 U/L 64    AST 15 - 41 U/L 28    ALT 0 - 44 U/L 31       No results found for: "CEA1", "CEA" / No results found for: "CEA1", "CEA" No results found for: "PSA1" Lab Results  Component Value Date   BJY782 27 06/28/2022   No results found for: "CAN125"  No results found for: "TOTALPROTELP", "ALBUMINELP", "A1GS", "A2GS", "BETS", "BETA2SER", "GAMS", "MSPIKE", "SPEI" Lab Results  Component Value Date   TIBC 285 06/28/2022   FERRITIN 132 06/28/2022   IRONPCTSAT 13 06/28/2022   No results found for: "LDH"   STUDIES:   CT ABDOMEN W WO CONTRAST  Result Date: 07/05/2022 CLINICAL DATA:  Pancreatic carcinoma. Staging. * Tracking Code: BO * EXAM: CT ABDOMEN WITHOUT AND WITH CONTRAST TECHNIQUE: Multidetector CT imaging of the abdomen was performed following the pancreatic protocol before and following the bolus administration of intravenous contrast. RADIATION DOSE REDUCTION: This exam was performed according to the departmental dose-optimization program which includes automated exposure control, adjustment of the mA and/or kV according to patient size and/or use of iterative reconstruction technique. CONTRAST:  80mL OMNIPAQUE IOHEXOL 300 MG/ML  SOLN COMPARISON:  06/22/2022 FINDINGS: Lower chest: No acute findings.  Hepatobiliary: Diffuse hepatic steatosis again noted. No hepatic masses identified. Gallstones are again seen largest measuring 3.7 cm, however there is no evidence of cholecystitis or biliary ductal dilatation. Pancreas: Diffuse atrophy is again seen involving the body and tail. A persistent solid masslike density is seen in the pancreatic head, measuring 4.0 x 3.7 cm on image 53/9, suspicious for pancreatic carcinoma. This abuts the right lateral wall of the superior mesenteric vein (see image 53/9) in the inferior wall of the proximal main portal vein (see image 51/13), but does not show encasement of either vessel. No evidence of involvement of the celiac or superior mesenteric arteries or proximal branches. Spleen:  Within normal limits in size and appearance. Adrenals/Urinary Tract: Prior left nephrectomy again noted. No evidence of right renal mass or hydronephrosis. Stomach/Bowel: Large left-sided spigelian hernia is seen which contains multiple small bowel loops. Evidence of bowel obstruction or strangulation. Vascular/Lymphatic: No pathologically enlarged lymph nodes identified. No acute vascular findings. Other:  None. Musculoskeletal:  No suspicious bone lesions identified. IMPRESSION: 4 cm solid masslike density in pancreatic head, suspicious for pancreatic carcinoma. This abuts the superior mesenteric vein and proximal main portal vein, but does not show encasement of either vessel. No evidence of arterial involvement. No evidence of abdominal metastatic disease. Stable hepatic steatosis. Cholelithiasis. No radiographic evidence of cholecystitis. 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 No evidence of bowel obstruction or strangulation. Electronically Signed   By: Danae Orleans M.D.   On: 07/05/2022 09:45

## 2022-07-26 ENCOUNTER — Inpatient Hospital Stay: Payer: Medicaid Other | Attending: Hematology | Admitting: Hematology

## 2022-07-26 VITALS — BP 158/88 | HR 76 | Temp 98.1°F | Resp 16 | Wt 231.4 lb

## 2022-07-26 DIAGNOSIS — Z801 Family history of malignant neoplasm of trachea, bronchus and lung: Secondary | ICD-10-CM | POA: Insufficient documentation

## 2022-07-26 DIAGNOSIS — Z800 Family history of malignant neoplasm of digestive organs: Secondary | ICD-10-CM | POA: Insufficient documentation

## 2022-07-26 DIAGNOSIS — F17290 Nicotine dependence, other tobacco product, uncomplicated: Secondary | ICD-10-CM | POA: Insufficient documentation

## 2022-07-26 DIAGNOSIS — K8689 Other specified diseases of pancreas: Secondary | ICD-10-CM

## 2022-07-26 DIAGNOSIS — Z8049 Family history of malignant neoplasm of other genital organs: Secondary | ICD-10-CM | POA: Diagnosis not present

## 2022-07-26 NOTE — Patient Instructions (Signed)
[facility] at [facility] Discharge Instructions   You were seen and examined today by Dr. Ellin Saba.  We will refer you to Dr. Freida Busman for surgical consult to remove the pancreatic mass.   Proceed with the EUS with Dr. Meridee Score as scheduled.   Return as scheduled.    Thank you for choosing [facility] at [facility] to provide your oncology and hematology care.  To afford each patient quality time with our provider, please arrive at least 15 minutes before your scheduled appointment time.   If you have a lab appointment with the Cancer Center please come in thru the Main Entrance and check in at the main information desk.  You need to re-schedule your appointment should you arrive 10 or more minutes late.  We strive to give you quality time with our providers, and arriving late affects you and other patients whose appointments are after yours.  Also, if you no show three or more times for appointments you may be dismissed from the clinic at the providers discretion.     Again, thank you for choosing [facility].  Our hope is that these requests will decrease the amount of time that you wait before being seen by our physicians.       _____________________________________________________________  Should you have questions after your visit to [facility], please contact our office at [phone] and follow the prompts.  Our office hours are 8:00 a.m. and 4:30 p.m. Monday - Friday.  Please note that voicemails left after 4:00 p.m. may not be returned until the following business day.  We are closed weekends and major holidays.  You do have access to a nurse 24-7, just call the main number to the clinic [phone] and do not press any options, hold on the line and a nurse will answer the phone.    For prescription refill requests, have your pharmacy contact our office and allow 72 hours.    Due to Covid, you will need  to wear a mask upon entering the hospital. If you do not have a mask, a mask will be given to you at the Main Entrance upon arrival. For doctor visits, patients may have 1 support person age 40 or older with them. For treatment visits, patients can not have anyone with them due to social distancing guidelines and our immunocompromised population.

## 2022-07-27 ENCOUNTER — Telehealth: Payer: Self-pay | Admitting: Gastroenterology

## 2022-07-27 NOTE — Telephone Encounter (Signed)
Hello Dr Meridee Score, we need information as to why the pt had to have the EUS at the hospital  on 07/09/22 vs a outpatient based facility. I will fax over the information to [facility], thank you!

## 2022-08-08 ENCOUNTER — Telehealth: Payer: Self-pay | Admitting: Gastroenterology

## 2022-08-08 NOTE — Progress Notes (Signed)
Patient called and stated she wanted to cancel her appointment with dr. Meridee Score as her doctor wanted her to have it at [facility]. I told her I could take her off our schedule and asked her to call dr. Elesa Hacker office and let them know.

## 2022-08-08 NOTE — Telephone Encounter (Signed)
Inbound call from patient requesting to cancel endoscopy procedure at [facility]. States she is going to have procedure done by [facility]. Please advise, thank you.

## 2022-08-08 NOTE — Telephone Encounter (Signed)
Noted. appt cancelled.  

## 2022-08-09 ENCOUNTER — Ambulatory Visit (HOSPITAL_COMMUNITY)
Admission: RE | Admit: 2022-08-09 | Discharge: 2022-08-09 | Disposition: A | Discharge status: Home / Self Care | Payer: Medicaid Other | Source: Ambulatory Visit | Attending: Hematology | Admitting: Hematology

## 2022-08-09 ENCOUNTER — Encounter (HOSPITAL_COMMUNITY): Payer: Medicaid Other

## 2022-08-09 DIAGNOSIS — K8689 Other specified diseases of pancreas: Secondary | ICD-10-CM | POA: Diagnosis present

## 2022-08-09 MED ORDER — FLUDEOXYGLUCOSE F - 18 (FDG) INJECTION
11.8600 | Freq: Once | INTRAVENOUS | Status: AC | PRN
  Administered 2022-08-09: 11.86 via INTRAVENOUS

## 2022-08-13 ENCOUNTER — Encounter: Payer: Self-pay | Admitting: Licensed Clinical Social Worker

## 2022-08-13 DIAGNOSIS — Z1379 Encounter for other screening for genetic and chromosomal anomalies: Secondary | ICD-10-CM | POA: Insufficient documentation

## 2022-08-16 ENCOUNTER — Other Ambulatory Visit (HOSPITAL_COMMUNITY): Payer: Medicaid Other

## 2022-08-20 ENCOUNTER — Ambulatory Visit (HOSPITAL_COMMUNITY): Admit: 2022-08-20 | Payer: Medicaid Other | Admitting: Gastroenterology

## 2022-08-20 ENCOUNTER — Encounter (HOSPITAL_COMMUNITY): Payer: Self-pay

## 2022-08-20 SURGERY — UPPER ENDOSCOPIC ULTRASOUND (EUS) RADIAL
Anesthesia: Monitor Anesthesia Care

## 2022-08-22 ENCOUNTER — Encounter: Payer: Self-pay | Admitting: Genetic Counselor

## 2022-08-23 ENCOUNTER — Encounter: Payer: Self-pay | Admitting: Genetic Counselor

## 2022-08-23 ENCOUNTER — Inpatient Hospital Stay: Payer: Medicaid Other | Attending: Hematology | Admitting: Genetic Counselor

## 2022-08-23 DIAGNOSIS — R200 Anesthesia of skin: Secondary | ICD-10-CM | POA: Insufficient documentation

## 2022-08-23 DIAGNOSIS — Z8049 Family history of malignant neoplasm of other genital organs: Secondary | ICD-10-CM | POA: Insufficient documentation

## 2022-08-23 DIAGNOSIS — F17290 Nicotine dependence, other tobacco product, uncomplicated: Secondary | ICD-10-CM | POA: Insufficient documentation

## 2022-08-23 DIAGNOSIS — R918 Other nonspecific abnormal finding of lung field: Secondary | ICD-10-CM | POA: Insufficient documentation

## 2022-08-23 DIAGNOSIS — Z800 Family history of malignant neoplasm of digestive organs: Secondary | ICD-10-CM

## 2022-08-23 DIAGNOSIS — Z1379 Encounter for other screening for genetic and chromosomal anomalies: Secondary | ICD-10-CM

## 2022-08-23 DIAGNOSIS — K8689 Other specified diseases of pancreas: Secondary | ICD-10-CM | POA: Insufficient documentation

## 2022-08-23 DIAGNOSIS — Z801 Family history of malignant neoplasm of trachea, bronchus and lung: Secondary | ICD-10-CM | POA: Insufficient documentation

## 2022-08-23 HISTORY — DX: Family history of malignant neoplasm of digestive organs: Z80.0

## 2022-08-23 NOTE — Progress Notes (Signed)
REFERRING PROVIDER: Doreatha Massed, MD [address] [place],  [place] [address]  PRIMARY PROVIDER:  [facility]  PRIMARY REASON FOR VISIT:  1. Pancreatic mass   2. Genetic testing   3. Family history of pancreatic cancer      HISTORY OF PRESENT ILLNESS:   Alexis Cain, a 62 y.o. female, was seen for a [facility] cancer genetics consultation at the request of Dr. Ellin Saba due to a family history of cancer.  Alexis Cain presents to clinic today to discuss the possibility of a hereditary predisposition to cancer, genetic testing, and to further clarify her future cancer risks, as well as potential cancer risks for family members.   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 The treatment plan has been additional imaging which is now suggestive that she could have inflammation of the pancreas.  This is still being worked up.      CANCER HISTORY:  Oncology History   No history exists.     RISK FACTORS:  Menarche was at age 61.  First live birth at age 47.  Ovaries intact: yes.  Hysterectomy: no.  Menopausal status: postmenopausal.  HRT use: 0 years. Colonoscopy: no; not examined. Mammogram within the last year: yes. Number of breast biopsies: 0. Up to date with pelvic exams: yes. Any excessive radiation exposure in the past: no  Past Medical History:  Diagnosis Date   Anxiety    Arthritis    Family history of pancreatic cancer 08/23/2022   PONV (postoperative nausea and vomiting)    Renal disorder     Past Surgical History:  Procedure Laterality Date   BIOPSY  07/09/2022   Procedure: BIOPSY;  Surgeon: Lemar Lofty., MD;  Location: [facility];  Service: Gastroenterology;;   ESOPHAGOGASTRODUODENOSCOPY N/A 07/09/2022   Procedure: ESOPHAGOGASTRODUODENOSCOPY (EGD);  Surgeon: Lemar Lofty., MD;  Location: [facility];  Service: Gastroenterology;  Laterality: N/A;   EUS  N/A 07/09/2022   Procedure: UPPER ENDOSCOPIC ULTRASOUND (EUS) RADIAL;  Surgeon: Lemar Lofty., MD;  Location: [facility];  Service: Gastroenterology;  Laterality: N/A;   FINE NEEDLE ASPIRATION N/A 07/09/2022   Procedure: FINE NEEDLE ASPIRATION (FNA) LINEAR;  Surgeon: Lemar Lofty., MD;  Location: [facility];  Service: Gastroenterology;  Laterality: N/A;   HERNIA REPAIR     KIDNEY SURGERY Left     Social History   Socioeconomic History   Marital status: Married    Spouse name: Not on file   Number of children: Not on file   Years of education: Not on file   Highest education level: Not on file  Occupational History   Not on file  Tobacco Use   Smoking status: Every Day    Types: E-cigarettes   Smokeless tobacco: Current  Vaping Use   Vaping Use: Every day  Substance and Sexual Activity   Alcohol use: Not Currently    Comment: socially   Drug use: Never   Sexual activity: Yes  Other Topics Concern   Not on file  Social History Narrative   Not on file   Social Determinants of Health   Financial Resource Strain: Not on file  Food Insecurity: No Food Insecurity (06/28/2022)   Hunger Vital Sign    Worried About Running Out of Food in the Last Year: Never true    Ran Out of Food in the Last Year: Never true  Transportation Needs: No Transportation Needs (  06/28/2022)   PRAPARE - Administrator, Civil Service (Medical): No    Lack of Transportation (Non-Medical): No  Physical Activity: Not on file  Stress: Not on file  Social Connections: Not on file     FAMILY HISTORY:  We obtained a detailed, 4-generation family history.  Significant diagnoses are listed below: Family History  Problem Relation Age of Onset   Pancreatic cancer Father        bile duct   Throat cancer Brother    Lung cancer Paternal Uncle    Cervical cancer Maternal Aunt    Cervical cancer Maternal Aunt      The patient has two children who are cancer free.  She has  one brother who has throat cancer.  Her mother is living and her father is deceased.  The patient's mother has never had cancer.  She has seven sisters and a brother.  The brother had liver cancer, one sister had ovarian cancer and another with cervical cancer.  The maternal grandparents died of non-cancer related issues.  The patient's father had bile duct cancer at 20.  He had three brothers and a sister.  One brother had lung cancer. There is no other reported family history of cancer.  [name] is unaware of previous family history of genetic testing for hereditary cancer risks. Patient's maternal ancestors are of Caucasian descent, and paternal ancestors are of [place] descent. There is no reported Ashkenazi Jewish ancestry. There is no known consanguinity.  GENETIC COUNSELING ASSESSMENT: Alexis Cain is a 62 y.o. female with a family history of cancer which is somewhat suggestive of a hereditary cancer syndrome and predisposition to cancer given the type of cancer in the family. We, therefore, discussed and recommended the following at today's visit.   DISCUSSION: We discussed that, in general, most cancer is not inherited in families, but instead is sporadic or familial. Sporadic cancers occur by chance and typically happen at older ages (>50 years) as this type of cancer is caused by genetic changes acquired during an individual's lifetime. Some families have more cancers than would be expected by chance; however, the ages or types of cancer are not consistent with a known genetic mutation or known genetic mutations have been ruled out. This type of familial cancer is thought to be due to a combination of multiple genetic, environmental, hormonal, and lifestyle factors. While this combination of factors likely increases the risk of cancer, the exact source of this risk is not currently identifiable or testable.  We discussed that 5 - 10% of cancer is hereditary.  Most cases of pancreatic cancer  and ovarian cancer, the type of cancer in her family, is associated with BRCA mutations.  There are other genes that can be associated with hereditary pancreatic and ovarian cancer syndromes.  These include Lynch syndrome, PALB2 and a few others.  We discussed that testing is beneficial for several reasons including knowing how to follow individuals after completing their treatment, identifying whether potential treatment options such as PARP inhibitors would be beneficial, and understand if other family members could be at risk for cancer and allow them to undergo genetic testing.   We reviewed the characteristics, features and inheritance patterns of hereditary cancer syndromes. We also discussed genetic testing, including the appropriate family members to test, the process of testing, insurance coverage and turn-around-time for results. We discussed the implications of a negative, positive, carrier and/or variant of uncertain significant result. Alexis Cain decided to pursue genetic testing  for the Common hereditary cancer panel + RNA gene panel.   GENETIC TEST RESULTS: Genetic testing reported out on August 12, 2022 through the Common hereditary cancer panel + RN cancer panel found no pathogenic mutations. The Common Hereditary Gene Panel offered by [facility] includes sequencing and/or deletion duplication testing of the following 47 genes: APC, ATM, AXIN2, BARD1, BMPR1A, BRCA1, BRCA2, BRIP1, CDH1, CDK4, CDKN2A (p14ARF), CDKN2A (p16INK4a), CHEK2, CTNNA1, DICER1, EPCAM (Deletion/duplication testing only), GREM1 (promoter region deletion/duplication testing only), KIT, MEN1, MLH1, MSH2, MSH3, MSH6, MUTYH, NBN, NF1, NHTL1, PALB2, PDGFRA, PMS2, POLD1, POLE, PTEN, RAD50, RAD51C, RAD51D, SDHB, SDHC, SDHD, SMAD4, SMARCA4. STK11, TP53, TSC1, TSC2, and VHL.  The following genes were evaluated for sequence changes only: SDHA and HOXB13 c.251G>A variant only. The test report has been scanned into EPIC and is located under  the Molecular Pathology section of the Results Review tab.  A portion of the result report is included below for reference.     We discussed with Alexis Cain that because current genetic testing is not perfect, it is possible there may be a gene mutation in one of these genes that current testing cannot detect, but that chance is small.  We also discussed, that there could be another gene that has not yet been discovered, or that we have not yet tested, that is responsible for the cancer diagnoses in the family. It is also possible there is a hereditary cause for the cancer in the family that Alexis Cain did not inherit and therefore was not identified in her testing.  Therefore, it is important to remain in touch with cancer genetics in the future so that we can continue to offer Alexis Cain the most up to date genetic testing.   ADDITIONAL GENETIC TESTING: We discussed with Alexis Cain that there are other genes that are associated with increased cancer risk that can be analyzed. Should Alexis Cain wish to pursue additional genetic testing, we are happy to discuss and coordinate this testing, at any time.    CANCER SCREENING RECOMMENDATIONS: Alexis Cain test result is considered negative (normal).  This means that we have not identified a hereditary cause for her family history of cancer at this time. Most cancers happen by chance and this negative test suggests that her cancer may fall into this category.    Possible reasons for Alexis Cain's negative genetic test include:  1. There may be a gene mutation in one of these genes that current testing methods cannot detect but that chance is small.  2. There could be another gene that has not yet been discovered, or that we have not yet tested, that is responsible for the cancer diagnoses in the family.  3.  There may be no hereditary risk for cancer in the family. The cancers in Alexis Cain and/or her family may be sporadic/familial or due to other  genetic and environmental factors. 4. It is also possible there is a hereditary cause for the cancer in the family that Alexis Cain did not inherit.  Therefore, it is recommended she continue to follow the cancer management and screening guidelines provided by her oncology and primary healthcare provider. An individual's cancer risk and medical management are not determined by genetic test results alone. Overall cancer risk assessment incorporates additional factors, including personal medical history, family history, and any available genetic information that may result in a personalized plan for cancer prevention and surveillance  RECOMMENDATIONS FOR FAMILY MEMBERS:  Individuals in this family might be at some  increased risk of developing cancer, over the general population risk, simply due to the family history of cancer.  We recommended women in this family have a yearly mammogram beginning at age 71, or 59 years younger than the earliest onset of cancer, an annual clinical breast exam, and perform monthly breast self-exams. Women in this family should also have a gynecological exam as recommended by their primary provider. All family members should be referred for colonoscopy starting at age 55.  FOLLOW-UP: Lastly, we discussed with Alexis Cain that cancer genetics is a rapidly advancing field and it is possible that new genetic tests will be appropriate for her and/or her family members in the future. We encouraged her to remain in contact with cancer genetics on an annual basis so we can update her personal and family histories and let her know of advances in cancer genetics that may benefit this family.   Our contact number was provided. Alexis Cain questions were answered to her satisfaction, and she knows she is welcome to call us at anytime with additional questions or concerns.   Maylon Cos, MS, [facility] Licensed, Certified Genetic Counselor [email or web address]   The patient was  seen for a total of 25 minutes in face-to-face genetic counseling.  The patient brought her daughter in law. Drs. Meliton Rattan, and/or [place] were available for questions, if needed..    _______________________________________________________________________ For Office Staff:  Number of people involved in session: 2 Was an Intern/ student involved with case: no

## 2022-08-28 ENCOUNTER — Inpatient Hospital Stay (HOSPITAL_BASED_OUTPATIENT_CLINIC_OR_DEPARTMENT_OTHER): Payer: Medicaid Other | Admitting: Hematology

## 2022-08-28 VITALS — BP 159/86 | HR 88 | Temp 97.7°F | Resp 18 | Wt 228.6 lb

## 2022-08-28 DIAGNOSIS — Z801 Family history of malignant neoplasm of trachea, bronchus and lung: Secondary | ICD-10-CM | POA: Diagnosis not present

## 2022-08-28 DIAGNOSIS — F17290 Nicotine dependence, other tobacco product, uncomplicated: Secondary | ICD-10-CM | POA: Diagnosis not present

## 2022-08-28 DIAGNOSIS — R200 Anesthesia of skin: Secondary | ICD-10-CM | POA: Diagnosis not present

## 2022-08-28 DIAGNOSIS — R918 Other nonspecific abnormal finding of lung field: Secondary | ICD-10-CM | POA: Diagnosis not present

## 2022-08-28 DIAGNOSIS — K8689 Other specified diseases of pancreas: Secondary | ICD-10-CM

## 2022-08-28 DIAGNOSIS — Z8049 Family history of malignant neoplasm of other genital organs: Secondary | ICD-10-CM | POA: Diagnosis not present

## 2022-08-28 DIAGNOSIS — Z800 Family history of malignant neoplasm of digestive organs: Secondary | ICD-10-CM | POA: Diagnosis not present

## 2022-08-28 NOTE — Patient Instructions (Addendum)
[facility]  Discharge Instructions  You were seen and examined today by Dr. Ellin Saba.  Dr. Ellin Saba discussed your most recent PET scan which revealed that everything looks good.   Dr. Ellin Saba is going to reach out to Dr. Flonnie Hailstone and he will repeat labs before your next appointment.  Follow-up as scheduled in 4 months.    Thank you for choosing  [facility] to provide your oncology and hematology care.   To afford each patient quality time with our provider, please arrive at least 15 minutes before your scheduled appointment time. You may need to reschedule your appointment if you arrive late (10 or more minutes). Arriving late affects you and other patients whose appointments are after yours.  Also, if you miss three or more appointments without notifying the office, you may be dismissed from the clinic at the provider's discretion.    Again, thank you for choosing [facility].  Our hope is that these requests will decrease the amount of time that you wait before being seen by our physicians.   If you have a lab appointment with the Cancer Center - please note that after April 8th, all labs will be drawn in the cancer center.  You do not have to check in or register with the main entrance as you have in the past but will complete your check-in at the cancer center.            _____________________________________________________________  Should you have questions after your visit to [facility], please contact our office at [phone] and follow the prompts.  Our office hours are 8:00 a.m. to 4:30 p.m. Monday - Thursday and 8:00 a.m. to 2:30 p.m. Friday.  Please note that voicemails left after 4:00 p.m. may not be returned until the following business day.  We are closed weekends and all major holidays.  You do have access to a nurse 24-7, just call the main number to the clinic [phone] and do not  press any options, hold on the line and a nurse will answer the phone.    For prescription refill requests, have your pharmacy contact our office and allow 72 hours.    Masks are no longer required in the cancer centers. If you would like for your care team to wear a mask while they are taking care of you, please let them know. You may have one support person who is at least 61 years old accompany you for your appointments.

## 2022-08-28 NOTE — Progress Notes (Signed)
[facility] [address], [place] [address]    Clinic Day:  08/28/2022  Referring physician: [facility]  Patient Care Team: [facility] as PCP - General Doreatha Massed, MD as Medical Oncologist (Medical Oncology) Therese Sarah, RN as Oncology Nurse Navigator (Medical Oncology)   ASSESSMENT & PLAN:   Assessment: 1.  Pancreatic head mass: - Patient went to the ER with symptoms of right-sided pyelonephritis. - CTAP (06/22/2022): 5.6 cm mass in the pancreatic head/uncinate process concerning for primary pancreatic neoplasm.  Associated atrophy of the pancreatic body/tail. - She denies any weight loss or abdominal pain.  Currently on antibiotics cephalexin 500 4 times daily for pyelonephritis. - She has numbness in the feet only when the feet swell after prolonged standing. - EUS/FNA by Dr. Meridee Score: No malignant cells.   2.  Social/family history: - Lives at home with her husband.  Currently not working.  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  She also form tobacco many years ago.  She currently vapes for the past 10 years.  She smoked 1 pack of cigarettes per day for 20 years prior to vaping. - Father had cholangiocarcinoma.  Brother had throat cancer and was smoker.  Paternal uncle had lung cancer.  2 maternal aunts had cervical cancer.    Plan: 1.  Pancreatic head mass: - She was evaluated by Dr. Flonnie Hailstone at [facility]. - She underwent a repeat US on 08/10/2022: No pancreatic head mass was seen and hence no biopsy was done. - She underwent CT CAP (08/13/2022): Redemonstrated masslike lesion involving pancreatic head with atrophy of the body and tail concerning for malignancy.  Mass abuts portal vein at the portal splenic confluence and SMV without encasement.  Mass also abuts and possibly invades gastric pylorus.  No arterial involvement.  No pathologically enlarged lymph nodes.  Bilateral pulmonary  micronodules which are nonspecific.  Scattered osseous sclerotic foci. - She had 1 episode of gallbladder attack on 08/20/2022 after eating rich food with resulting pain in the right upper quadrant radiating to the back which lasted hours along with biliary vomiting which subsided. - We also reviewed PET scan from 08/09/2022: Masslike appearance of the pancreatic head with abrupt parenchymal atrophy of the body and tail.  There is no convincing focal hypermetabolism in the pancreatic head to support diagnosis of pancreatic adenocarcinoma.  Mild/vague hypermetabolism max SUV 3.4.  Wil less than that of mediastinal blood pool. - She has a follow-up with Dr. Flonnie Hailstone. - Recommend follow-up in 4 months with repeat labs and tumor marker.    Orders Placed This Encounter  Procedures   CBC with Differential/Platelet    Standing Status:   Future    Standing Expiration Date:   08/28/2023    Order Specific Question:   Release to patient    Answer:   Immediate   Comprehensive metabolic panel    Standing Status:   Future    Standing Expiration Date:   08/28/2023    Order Specific Question:   Release to patient    Answer:   Immediate   Cancer antigen 19-9    Standing Status:   Future    Standing Expiration Date:   08/28/2023      I,Katie Daubenspeck,acting as a scribe for Doreatha Massed, MD.,have documented all relevant documentation on the behalf of Doreatha Massed, MD,as directed by  Doreatha Massed, MD while in the presence of Doreatha Massed, MD.   I, Doreatha Massed  MD, have reviewed the above documentation for accuracy and completeness, and I agree with the above.   Doreatha Massed, MD   6/25/20245:40 PM  CHIEF COMPLAINT:   Diagnosis: pancreatic head mass    Cancer Staging  No matching staging information was found for the patient.   Prior Therapy: none  Current Therapy: Observation   HISTORY OF PRESENT ILLNESS:   Oncology History   No history exists.      INTERVAL HISTORY:   Shalaine is a 62 y.o. female presenting to clinic today for follow up of pancreatic head mass. She was last seen by me on 07/26/22.  Since her last visit, she underwent PET scan on 08/09/22 showing: mass-like appearance of pancreatic head, similar to 2020, does not demonstrate convincing hypermetabolism; no findings suspicious for metastases.  She also underwent EUS on 08/10/22 at [facility] under Dr. Pricilla Riffle. This showed no evidence of pancreatic abnormalities.  She also underwent CT C/A/P on 08/13/22 at [facility] showing: redemonstrated mass-like lesion involving pancreatic head, with atrophy of pancreatic body and tail, abutting pain portal vein at portosplenic confluence and SMV without encasement and abutting and possibly invading gastric pylorus; no pathologically enlarged lymph nodes; nonspecific bilateral pulmonary micronodules, indeterminate; scattered osseous sclerotic foci, which may represent benign bone islands but metastatic disease not excluded.  Today, she states that she is doing well overall. Her appetite level is at 90%. Her energy level is at 73%.  PAST MEDICAL HISTORY:   Past Medical History: Past Medical History:  Diagnosis Date   Anxiety    Arthritis    Family history of pancreatic cancer 08/23/2022   PONV (postoperative nausea and vomiting)    Renal disorder     Surgical History: Past Surgical History:  Procedure Laterality Date   BIOPSY  07/09/2022   Procedure: BIOPSY;  Surgeon: Lemar Lofty., MD;  Location: [facility];  Service: Gastroenterology;;   ESOPHAGOGASTRODUODENOSCOPY N/A 07/09/2022   Procedure: ESOPHAGOGASTRODUODENOSCOPY (EGD);  Surgeon: Lemar Lofty., MD;  Location: [facility];  Service: Gastroenterology;  Laterality: N/A;   EUS N/A 07/09/2022   Procedure: UPPER ENDOSCOPIC ULTRASOUND (EUS) RADIAL;  Surgeon: Lemar Lofty., MD;  Location: [facility];  Service: Gastroenterology;  Laterality: N/A;    FINE NEEDLE ASPIRATION N/A 07/09/2022   Procedure: FINE NEEDLE ASPIRATION (FNA) LINEAR;  Surgeon: Lemar Lofty., MD;  Location: [facility];  Service: Gastroenterology;  Laterality: N/A;   HERNIA REPAIR     KIDNEY SURGERY Left     Social History: Social History   Socioeconomic History   Marital status: Married    Spouse name: Not on file   Number of children: Not on file   Years of education: Not on file   Highest education level: Not on file  Occupational History   Not on file  Tobacco Use   Smoking status: Every Day    Types: E-cigarettes   Smokeless tobacco: Current  Vaping Use   Vaping Use: Every day  Substance and Sexual Activity   Alcohol use: Not Currently    Comment: socially   Drug use: Never   Sexual activity: Yes  Other Topics Concern   Not on file  Social History Narrative   Not on file   Social Determinants of Health   Financial Resource Strain: Not on file  Food Insecurity: No Food Insecurity (06/28/2022)   Hunger Vital Sign    Worried About Running Out of Food in the Last Year: Never true    Ran Out of Food  in the Last Year: Never true  Transportation Needs: No Transportation Needs (06/28/2022)   PRAPARE - Administrator, Civil Service (Medical): No    Lack of Transportation (Non-Medical): No  Physical Activity: Not on file  Stress: Not on file  Social Connections: Not on file  Intimate Partner Violence: Not At Risk (06/28/2022)   Humiliation, Afraid, Rape, and Kick questionnaire    Fear of Current or Ex-Partner: No    Emotionally Abused: No    Physically Abused: No    Sexually Abused: No    Family History: Family History  Problem Relation Age of Onset   Pancreatic cancer Father        bile duct   Throat cancer Brother 69   Ovarian cancer Maternal Aunt    Cervical cancer Maternal Aunt    Liver cancer Maternal Uncle    Heart Problems Paternal Aunt    Lung cancer Paternal Uncle    Dementia Paternal Uncle    Heart  Problems Paternal Uncle    Heart failure Maternal Grandmother    Stroke Maternal Grandfather    Heart Problems Paternal Grandmother    Lupus Paternal Grandfather     Current Medications:  Current Outpatient Medications:    Cholecalciferol (VITAMIN D3) 25 MCG (1000 UT) CAPS, Take 2,000 Units by mouth daily., Disp: , Rfl:    Citalopram Hydrobromide 30 MG CAPS, , Disp: , Rfl:    Cyanocobalamin (VITAMIN B-12 CR PO), Take by mouth., Disp: , Rfl:    omeprazole (PRILOSEC) 20 MG capsule, Take 1 capsule twice daily for 10 days, then 1 daily for 1 month, Disp: 60 capsule, Rfl: 0   Turmeric (QC TUMERIC COMPLEX) 500 MG CAPS, Take 2 tablets by mouth daily., Disp: , Rfl:    Allergies: Allergies  Allergen Reactions   Sulfa Antibiotics Anaphylaxis    REVIEW OF SYSTEMS:   Review of Systems  Constitutional:  Negative for chills, fatigue and fever.  HENT:   Negative for lump/mass, mouth sores, nosebleeds, sore throat and trouble swallowing.   Eyes:  Negative for eye problems.  Respiratory:  Negative for cough and shortness of breath.   Cardiovascular:  Negative for chest pain, leg swelling and palpitations.  Gastrointestinal:  Negative for abdominal pain, constipation, diarrhea, nausea and vomiting.  Genitourinary:  Negative for bladder incontinence, difficulty urinating, dysuria, frequency, hematuria and nocturia.   Musculoskeletal:  Negative for arthralgias, back pain, flank pain, myalgias and neck pain.  Skin:  Negative for itching and rash.  Neurological:  Positive for dizziness. Negative for headaches and numbness.  Hematological:  Does not bruise/bleed easily.  Psychiatric/Behavioral:  Negative for depression, sleep disturbance and suicidal ideas. The patient is not nervous/anxious.   All other systems reviewed and are negative.    VITALS:   Blood pressure (!) 159/86, pulse 88, temperature 97.7 F (36.5 C), temperature source Tympanic, resp. rate 18, weight 228 lb 9.6 oz (103.7 kg),  SpO2 97 %.  Wt Readings from Last 3 Encounters:  08/28/22 228 lb 9.6 oz (103.7 kg)  07/26/22 231 lb 6.4 oz (105 kg)  07/09/22 235 lb (106.6 kg)    Body mass index is 38.04 kg/m.  Performance status (ECOG): 1 - Symptomatic but completely ambulatory  PHYSICAL EXAM:   Physical Exam Vitals and nursing note reviewed. Exam conducted with a chaperone present.  Constitutional:      Appearance: Normal appearance.  Cardiovascular:     Rate and Rhythm: Normal rate and regular rhythm.  Pulses: Normal pulses.     Heart sounds: Normal heart sounds.  Pulmonary:     Effort: Pulmonary effort is normal.     Breath sounds: Normal breath sounds.  Abdominal:     Palpations: Abdomen is soft. There is no hepatomegaly, splenomegaly or mass.     Tenderness: There is no abdominal tenderness.  Musculoskeletal:     Right lower leg: No edema.     Left lower leg: No edema.  Lymphadenopathy:     Cervical: No cervical adenopathy.     Right cervical: No superficial, deep or posterior cervical adenopathy.    Left cervical: No superficial, deep or posterior cervical adenopathy.     Upper Body:     Right upper body: No supraclavicular or axillary adenopathy.     Left upper body: No supraclavicular or axillary adenopathy.  Neurological:     General: No focal deficit present.     Mental Status: She is alert and oriented to person, place, and time.  Psychiatric:        Mood and Affect: Mood normal.        Behavior: Behavior normal.     LABS:      Latest Ref Rng & Units 06/21/2022    8:01 PM  CBC  WBC 4.0 - 10.5 K/uL 11.0   Hemoglobin 12.0 - 15.0 g/dL 42.7   Hematocrit 06.2 - 46.0 % 39.8   Platelets 150 - 400 K/uL 227       Latest Ref Rng & Units 06/21/2022    9:32 PM 06/21/2022    8:01 PM  CMP  Glucose 70 - 99 mg/dL  376   BUN 6 - 20 mg/dL  13   Creatinine 2.83 - 1.00 mg/dL  1.51   Sodium 761 - 607 mmol/L  134   Potassium 3.5 - 5.1 mmol/L  3.9   Chloride 98 - 111 mmol/L  100   CO2 22 -  32 mmol/L  25   Calcium 8.9 - 10.3 mg/dL  8.5   Total Protein 6.5 - 8.1 g/dL 7.7    Total Bilirubin 0.3 - 1.2 mg/dL 0.4    Alkaline Phos 38 - 126 U/L 64    AST 15 - 41 U/L 28    ALT 0 - 44 U/L 31       No results found for: "CEA1", "CEA" / No results found for: "CEA1", "CEA" No results found for: "PSA1" Lab Results  Component Value Date   PXT062 27 06/28/2022   No results found for: "CAN125"  No results found for: "TOTALPROTELP", "ALBUMINELP", "A1GS", "A2GS", "BETS", "BETA2SER", "GAMS", "MSPIKE", "SPEI" Lab Results  Component Value Date   TIBC 285 06/28/2022   FERRITIN 132 06/28/2022   IRONPCTSAT 13 06/28/2022   No results found for: "LDH"   STUDIES:   NM PET Image Initial (PI) Skull Base To Thigh  Result Date: 08/16/2022 CLINICAL DATA:  Initial treatment strategy for pancreatic cancer. EXAM: NUCLEAR MEDICINE PET SKULL BASE TO THIGH TECHNIQUE: 11.9 mCi F-18 FDG was injected intravenously. Full-ring PET imaging was performed from the skull base to thigh after the radiotracer. CT data was obtained and used for attenuation correction and anatomic localization. Fasting blood glucose: 132 mg/dl COMPARISON:  CT abdomen dated 07/02/2022. CT abdomen/pelvis dated 06/22/2022. Outside hospital [facility]) CT abdomen dated 01/05/2019. FINDINGS: Mediastinal blood pool activity: SUV max 4.4 Liver activity: SUV max NA NECK: No hypermetabolic cervical lymphadenopathy. Incidental CT findings: None. CHEST: No hypermetabolic pulmonary nodules. No hypermetabolic thoracic lymphadenopathy.  Incidental CT findings: None. ABDOMEN/PELVIS: Persistent masslike appearance of the pancreatic head (series 3/image 161) with abrupt parenchymal atrophy of the pancreatic body/tail (series 3/image 148). However, there is no convincing focal hypermetabolism in the pancreatic head to support the diagnosis of pancreatic adenocarcinoma. Mild/vague hypermetabolism, max SUV 3.4, less than that of blood pool. Additionally,  there is now a remote comparison from 2020 which is available on PACS, demonstrating a somewhat similar appearance of the pancreas, arguing against pancreatic malignancy. No abnormal metabolism in the liver, spleen, or adrenal glands. Small upper abdominal nodes, including a 6 mm short axis portacaval node (series 3/image 148) with equivocal hypermetabolism, max SUV 4.0. This is not convincing and may be reactive. Incidental CT findings: Moderate hepatic steatosis. Cholelithiasis with sludge in the gallbladder fundus, without associated inflammatory changes. Status post left nephrectomy. Moderate left lateral hernia containing multiple loops of nondilated small bowel (series 3/image 179). SKELETON: No focal hypermetabolic activity to suggest skeletal metastasis. Incidental CT findings: None. IMPRESSION: Masslike appearance of the pancreatic head is similar dating back to 2020 and does not demonstrate convincing hypermetabolism, arguing against pancreatic malignancy. Correlate with biopsy results. No findings suspicious for metastases. Additional ancillary findings as above. Electronically Signed   By: Charline Bills M.D.   On: 08/16/2022 00:07

## 2022-10-11 ENCOUNTER — Telehealth: Payer: Medicaid Other | Admitting: Licensed Clinical Social Worker

## 2022-12-26 ENCOUNTER — Inpatient Hospital Stay: Payer: Medicaid Other | Attending: Hematology

## 2022-12-26 DIAGNOSIS — F17290 Nicotine dependence, other tobacco product, uncomplicated: Secondary | ICD-10-CM | POA: Insufficient documentation

## 2022-12-26 DIAGNOSIS — K869 Disease of pancreas, unspecified: Secondary | ICD-10-CM | POA: Insufficient documentation

## 2022-12-26 DIAGNOSIS — Z79899 Other long term (current) drug therapy: Secondary | ICD-10-CM | POA: Diagnosis not present

## 2022-12-26 DIAGNOSIS — Z800 Family history of malignant neoplasm of digestive organs: Secondary | ICD-10-CM | POA: Insufficient documentation

## 2022-12-26 DIAGNOSIS — Z808 Family history of malignant neoplasm of other organs or systems: Secondary | ICD-10-CM | POA: Diagnosis not present

## 2022-12-26 DIAGNOSIS — Z801 Family history of malignant neoplasm of trachea, bronchus and lung: Secondary | ICD-10-CM | POA: Insufficient documentation

## 2022-12-26 DIAGNOSIS — K8689 Other specified diseases of pancreas: Secondary | ICD-10-CM

## 2022-12-26 LAB — CBC WITH DIFFERENTIAL/PLATELET
Abs Immature Granulocytes: 0.06 10*3/uL (ref 0.00–0.07)
Basophils Absolute: 0 10*3/uL (ref 0.0–0.1)
Basophils Relative: 1 %
Eosinophils Absolute: 0.2 10*3/uL (ref 0.0–0.5)
Eosinophils Relative: 3 %
HCT: 43.3 % (ref 36.0–46.0)
Hemoglobin: 12.9 g/dL (ref 12.0–15.0)
Immature Granulocytes: 1 %
Lymphocytes Relative: 24 %
Lymphs Abs: 1.5 10*3/uL (ref 0.7–4.0)
MCH: 24.6 pg — ABNORMAL LOW (ref 26.0–34.0)
MCHC: 29.8 g/dL — ABNORMAL LOW (ref 30.0–36.0)
MCV: 82.6 fL (ref 80.0–100.0)
Monocytes Absolute: 0.5 10*3/uL (ref 0.1–1.0)
Monocytes Relative: 9 %
Neutro Abs: 4 10*3/uL (ref 1.7–7.7)
Neutrophils Relative %: 62 %
Platelets: 250 10*3/uL (ref 150–400)
RBC: 5.24 MIL/uL — ABNORMAL HIGH (ref 3.87–5.11)
RDW: 14.9 % (ref 11.5–15.5)
WBC: 6.2 10*3/uL (ref 4.0–10.5)
nRBC: 0 % (ref 0.0–0.2)

## 2022-12-26 LAB — COMPREHENSIVE METABOLIC PANEL
ALT: 30 U/L (ref 0–44)
AST: 30 U/L (ref 15–41)
Albumin: 4 g/dL (ref 3.5–5.0)
Alkaline Phosphatase: 74 U/L (ref 38–126)
Anion gap: 10 (ref 5–15)
BUN: 14 mg/dL (ref 8–23)
CO2: 25 mmol/L (ref 22–32)
Calcium: 8.9 mg/dL (ref 8.9–10.3)
Chloride: 102 mmol/L (ref 98–111)
Creatinine, Ser: 0.88 mg/dL (ref 0.44–1.00)
GFR, Estimated: >60 mL/min (ref >60)
Glucose, Bld: 102 mg/dL — ABNORMAL HIGH (ref 70–99)
Potassium: 3.9 mmol/L (ref 3.5–5.1)
Sodium: 137 mmol/L (ref 135–145)
Total Bilirubin: 0.3 mg/dL (ref 0.3–1.2)
Total Protein: 7.7 g/dL (ref 6.5–8.1)

## 2022-12-27 LAB — CANCER ANTIGEN 19-9: CA 19-9: 24 U/mL (ref 0–35)

## 2023-01-02 ENCOUNTER — Inpatient Hospital Stay (HOSPITAL_BASED_OUTPATIENT_CLINIC_OR_DEPARTMENT_OTHER): Payer: Medicaid Other | Admitting: Hematology

## 2023-01-02 DIAGNOSIS — K8689 Other specified diseases of pancreas: Secondary | ICD-10-CM

## 2023-01-02 DIAGNOSIS — K869 Disease of pancreas, unspecified: Secondary | ICD-10-CM | POA: Diagnosis not present

## 2023-01-02 NOTE — Progress Notes (Signed)
[facility] [address], [place] [address]    Clinic Day:  01/02/2023  Referring physician: [facility]  Patient Care Team: [facility] as PCP - General Doreatha Massed, MD as Medical Oncologist (Medical Oncology) Therese Sarah, RN as Oncology Nurse Navigator (Medical Oncology)   ASSESSMENT & PLAN:   Assessment: 1.  Pancreatic head mass: - Patient went to the ER with symptoms of right-sided pyelonephritis. - CTAP (06/22/2022): 5.6 cm mass in the pancreatic head/uncinate process concerning for primary pancreatic neoplasm.  Associated atrophy of the pancreatic body/tail. - She denies any weight loss or abdominal pain.  Currently on antibiotics cephalexin 500 4 times daily for pyelonephritis. - She has numbness in the feet only when the feet swell after prolonged standing. - EUS/FNA by Dr. Meridee Score: No malignant cells. - She was evaluated by Dr. Flonnie Hailstone at [facility]. - She underwent a repeat US on 08/10/2022: No pancreatic head mass was seen and hence no biopsy was done. - She underwent CT CAP (08/13/2022): Redemonstrated masslike lesion involving pancreatic head with atrophy of the body and tail concerning for malignancy.  Mass abuts portal vein at the portal splenic confluence and SMV without encasement.  Mass also abuts and possibly invades gastric pylorus.  No arterial involvement.  No pathologically enlarged lymph nodes.  Bilateral pulmonary micronodules which are nonspecific.  Scattered osseous sclerotic foci. -PET scan from 08/09/2022: Masslike appearance of the pancreatic head with abrupt parenchymal atrophy of the body and tail.  There is no convincing focal hypermetabolism in the pancreatic head to support diagnosis of pancreatic adenocarcinoma.  Mild/vague hypermetabolism max SUV 3.4.    2.  Social/family history: - Lives at home with her husband.  Currently not working.  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  She also form tobacco many years ago.  She currently vapes for the past 10 years.  She smoked 1 pack of cigarettes per day for 20 years prior to vaping. - Father had cholangiocarcinoma.  Brother had throat cancer and was smoker.  Paternal uncle had lung cancer.  2 maternal aunts had cervical cancer.    Plan: 1.  Pancreatic head mass: - She reports soft stools after eating.  She is taking Imodium 2 times per week which is helping. - Occasional achy pain in the epigastric region. - Physical examination: Mild vague tenderness in the epigastric region.  No mass palpable. - Labs today: Normal LFTs.  CBC grossly normal.  CA 19-9 is 24. - She has an appointment at [facility] in January 2025 with a CT scan abdomen pancreatic protocol. - I will see her back in 6 months for follow-up.    Orders Placed This Encounter  Procedures   CBC with Differential/Platelet    Standing Status:   Future    Standing Expiration Date:   01/02/2024    Order Specific Question:   Release to patient    Answer:   Immediate   Comprehensive metabolic panel    Standing Status:   Future    Standing Expiration Date:   01/02/2024    Order Specific Question:   Release to patient    Answer:   Immediate   Cancer antigen 19-9    Standing Status:   Future    Standing Expiration Date:   01/02/2024      Alben Deeds Teague,acting as a scribe for Doreatha Massed, MD.,have documented all relevant documentation on the behalf of Doreatha Massed, MD,as  directed by  Doreatha Massed, MD while in the presence of Doreatha Massed, MD.  I, Doreatha Massed MD, have reviewed the above documentation for accuracy and completeness, and I agree with the above.    Doreatha Massed, MD   10/30/20244:56 PM  CHIEF COMPLAINT:   Diagnosis: pancreatic head mass    Cancer Staging  No matching staging information was found for the patient.    Prior Therapy: none  Current Therapy:  Observation   HISTORY OF PRESENT ILLNESS:   Oncology History   No history exists.     INTERVAL HISTORY:   Alexis Cain is a 62 y.o. female presenting to clinic today for follow up of pancreatic head mass. She was last seen by me on 08/28/22.  Today, she states that she is doing well overall. Her appetite level is at 70%. Her energy level is at 50%.  PAST MEDICAL HISTORY:   Past Medical History: Past Medical History:  Diagnosis Date   Anxiety    Arthritis    Family history of pancreatic cancer 08/23/2022   PONV (postoperative nausea and vomiting)    Renal disorder     Surgical History: Past Surgical History:  Procedure Laterality Date   BIOPSY  07/09/2022   Procedure: BIOPSY;  Surgeon: Lemar Lofty., MD;  Location: [facility];  Service: Gastroenterology;;   ESOPHAGOGASTRODUODENOSCOPY N/A 07/09/2022   Procedure: ESOPHAGOGASTRODUODENOSCOPY (EGD);  Surgeon: Lemar Lofty., MD;  Location: [facility];  Service: Gastroenterology;  Laterality: N/A;   EUS N/A 07/09/2022   Procedure: UPPER ENDOSCOPIC ULTRASOUND (EUS) RADIAL;  Surgeon: Lemar Lofty., MD;  Location: [facility];  Service: Gastroenterology;  Laterality: N/A;   FINE NEEDLE ASPIRATION N/A 07/09/2022   Procedure: FINE NEEDLE ASPIRATION (FNA) LINEAR;  Surgeon: Lemar Lofty., MD;  Location: [facility];  Service: Gastroenterology;  Laterality: N/A;   HERNIA REPAIR     KIDNEY SURGERY Left     Social History: Social History   Socioeconomic History   Marital status: Married    Spouse name: Not on file   Number of children: Not on file   Years of education: Not on file   Highest education level: Not on file  Occupational History   Not on file  Tobacco Use   Smoking status: Every Day    Types: E-cigarettes   Smokeless tobacco: Current  Vaping Use   Vaping status: Every Day  Substance and Sexual Activity   Alcohol use: Not Currently    Comment: socially   Drug use: Never   Sexual  activity: Yes  Other Topics Concern   Not on file  Social History Narrative   Not on file   Social Determinants of Health   Financial Resource Strain: Not on file  Food Insecurity: No Food Insecurity (06/28/2022)   Hunger Vital Sign    Worried About Running Out of Food in the Last Year: Never true    Ran Out of Food in the Last Year: Never true  Transportation Needs: No Transportation Needs (06/28/2022)   PRAPARE - Administrator, Civil Service (Medical): No    Lack of Transportation (Non-Medical): No  Physical Activity: Not on file  Stress: Not on file  Social Connections: Not on file  Intimate Partner Violence: Not At Risk (06/28/2022)   Humiliation, Afraid, Rape, and Kick questionnaire    Fear of Current or Ex-Partner: No    Emotionally Abused: No    Physically Abused: No    Sexually Abused: No    Family  History: Family History  Problem Relation Age of Onset   Pancreatic cancer Father        bile duct   Throat cancer Brother 33   Ovarian cancer Maternal Aunt    Cervical cancer Maternal Aunt    Liver cancer Maternal Uncle    Heart Problems Paternal Aunt    Lung cancer Paternal Uncle    Dementia Paternal Uncle    Heart Problems Paternal Uncle    Heart failure Maternal Grandmother    Stroke Maternal Grandfather    Heart Problems Paternal Grandmother    Lupus Paternal Grandfather     Current Medications:  Current Outpatient Medications:    Cholecalciferol (VITAMIN D3) 25 MCG (1000 UT) CAPS, Take 2,000 Units by mouth daily., Disp: , Rfl:    Citalopram Hydrobromide 30 MG CAPS, , Disp: , Rfl:    Cyanocobalamin (VITAMIN B-12 CR PO), Take by mouth., Disp: , Rfl:    omeprazole (PRILOSEC) 20 MG capsule, Take 1 capsule twice daily for 10 days, then 1 daily for 1 month, Disp: 60 capsule, Rfl: 0   Turmeric (QC TUMERIC COMPLEX) 500 MG CAPS, Take 2 tablets by mouth daily., Disp: , Rfl:    Allergies: Allergies  Allergen Reactions   Sulfa Antibiotics Anaphylaxis     REVIEW OF SYSTEMS:   Review of Systems  Constitutional:  Negative for chills, fatigue and fever.  HENT:   Negative for lump/mass, mouth sores, nosebleeds, sore throat and trouble swallowing.   Eyes:  Negative for eye problems.  Respiratory:  Negative for cough and shortness of breath.   Cardiovascular:  Negative for chest pain, leg swelling and palpitations.  Gastrointestinal:  Positive for diarrhea and nausea. Negative for abdominal pain, constipation and vomiting.  Genitourinary:  Negative for bladder incontinence, difficulty urinating, dysuria, frequency, hematuria and nocturia.   Musculoskeletal:  Negative for arthralgias, back pain, flank pain, myalgias and neck pain.  Skin:  Negative for itching and rash.  Neurological:  Positive for dizziness. Negative for headaches and numbness.  Hematological:  Does not bruise/bleed easily.  Psychiatric/Behavioral:  Negative for depression, sleep disturbance and suicidal ideas. The patient is nervous/anxious.   All other systems reviewed and are negative.    VITALS:   Blood pressure 138/77, pulse 66, temperature 97.9 F (36.6 C), temperature source Oral, resp. rate 18, height 5\' 5"  (1.651 m), weight 235 lb (106.6 kg).  Wt Readings from Last 3 Encounters:  01/02/23 235 lb (106.6 kg)  08/28/22 228 lb 9.6 oz (103.7 kg)  07/26/22 231 lb 6.4 oz (105 kg)    Body mass index is 39.11 kg/m.  Performance status (ECOG): 1 - Symptomatic but completely ambulatory  PHYSICAL EXAM:   Physical Exam Vitals and nursing note reviewed. Exam conducted with a chaperone present.  Constitutional:      Appearance: Normal appearance.  Cardiovascular:     Rate and Rhythm: Normal rate and regular rhythm.     Pulses: Normal pulses.     Heart sounds: Normal heart sounds.  Pulmonary:     Effort: Pulmonary effort is normal.     Breath sounds: Normal breath sounds.  Abdominal:     Palpations: Abdomen is soft. There is no hepatomegaly, splenomegaly or mass.      Tenderness: There is no abdominal tenderness.  Musculoskeletal:     Right lower leg: No edema.     Left lower leg: No edema.  Lymphadenopathy:     Cervical: No cervical adenopathy.     Right cervical: No superficial,  deep or posterior cervical adenopathy.    Left cervical: No superficial, deep or posterior cervical adenopathy.     Upper Body:     Right upper body: No supraclavicular or axillary adenopathy.     Left upper body: No supraclavicular or axillary adenopathy.  Neurological:     General: No focal deficit present.     Mental Status: She is alert and oriented to person, place, and time.  Psychiatric:        Mood and Affect: Mood normal.        Behavior: Behavior normal.     LABS:      Latest Ref Rng & Units 12/26/2022    1:52 PM 06/21/2022    8:01 PM  CBC  WBC 4.0 - 10.5 K/uL 6.2  11.0   Hemoglobin 12.0 - 15.0 g/dL 10.9  60.4   Hematocrit 36.0 - 46.0 % 43.3  39.8   Platelets 150 - 400 K/uL 250  227       Latest Ref Rng & Units 12/26/2022    1:52 PM 06/21/2022    9:32 PM 06/21/2022    8:01 PM  CMP  Glucose 70 - 99 mg/dL 540   981   BUN 8 - 23 mg/dL 14   13   Creatinine 1.91 - 1.00 mg/dL 4.78   2.95   Sodium 621 - 145 mmol/L 137   134   Potassium 3.5 - 5.1 mmol/L 3.9   3.9   Chloride 98 - 111 mmol/L 102   100   CO2 22 - 32 mmol/L 25   25   Calcium 8.9 - 10.3 mg/dL 8.9   8.5   Total Protein 6.5 - 8.1 g/dL 7.7  7.7    Total Bilirubin 0.3 - 1.2 mg/dL 0.3  0.4    Alkaline Phos 38 - 126 U/L 74  64    AST 15 - 41 U/L 30  28    ALT 0 - 44 U/L 30  31       No results found for: "CEA1", "CEA" / No results found for: "CEA1", "CEA" No results found for: "PSA1" Lab Results  Component Value Date   HYQ657 24 12/26/2022   No results found for: "CAN125"  No results found for: "TOTALPROTELP", "ALBUMINELP", "A1GS", "A2GS", "BETS", "BETA2SER", "GAMS", "MSPIKE", "SPEI" Lab Results  Component Value Date   TIBC 285 06/28/2022   FERRITIN 132 06/28/2022   IRONPCTSAT  13 06/28/2022   No results found for: "LDH"   STUDIES:   No results found.

## 2023-01-02 NOTE — Patient Instructions (Addendum)
[facility]  Discharge Instructions  You were seen and examined today by Dr. Ellin Saba.  Dr. Ellin Saba discussed your most recent lab work which revealed that everything looks good and stable.  Follow-up as scheduled in 6 months.    Thank you for choosing [facility] to provide your oncology and hematology care.   To afford each patient quality time with our provider, please arrive at least 15 minutes before your scheduled appointment time. You may need to reschedule your appointment if you arrive late (10 or more minutes). Arriving late affects you and other patients whose appointments are after yours.  Also, if you miss three or more appointments without notifying the office, you may be dismissed from the clinic at the provider's discretion.    Again, thank you for choosing [facility].  Our hope is that these requests will decrease the amount of time that you wait before being seen by our physicians.   If you have a lab appointment with the Cancer Center - please note that after April 8th, all labs will be drawn in the cancer center.  You do not have to check in or register with the main entrance as you have in the past but will complete your check-in at the cancer center.            _____________________________________________________________  Should you have questions after your visit to [facility], please contact our office at [phone] and follow the prompts.  Our office hours are 8:00 a.m. to 4:30 p.m. Monday - Thursday and 8:00 a.m. to 2:30 p.m. Friday.  Please note that voicemails left after 4:00 p.m. may not be returned until the following business day.  We are closed weekends and all major holidays.  You do have access to a nurse 24-7, just call the main number to the clinic [phone] and do not press any options, hold on the line and a nurse will answer the phone.    For prescription  refill requests, have your pharmacy contact our office and allow 72 hours.    Masks are no longer required in the cancer centers. If you would like for your care team to wear a mask while they are taking care of you, please let them know. You may have one support person who is at least 61 years old accompany you for your appointments.

## 2023-01-22 ENCOUNTER — Ambulatory Visit (INDEPENDENT_AMBULATORY_CARE_PROVIDER_SITE_OTHER): Payer: Medicaid Other | Admitting: Obstetrics & Gynecology

## 2023-01-22 ENCOUNTER — Encounter: Payer: Self-pay | Admitting: Obstetrics & Gynecology

## 2023-01-22 ENCOUNTER — Other Ambulatory Visit (HOSPITAL_COMMUNITY)
Admission: RE | Admit: 2023-01-22 | Discharge: 2023-01-22 | Disposition: A | Discharge status: Home / Self Care | Payer: Medicaid Other | Source: Ambulatory Visit | Attending: Obstetrics & Gynecology | Admitting: Obstetrics & Gynecology

## 2023-01-22 VITALS — BP 139/81 | HR 73 | Ht 65.5 in | Wt 236.8 lb

## 2023-01-22 DIAGNOSIS — Z124 Encounter for screening for malignant neoplasm of cervix: Secondary | ICD-10-CM

## 2023-01-22 DIAGNOSIS — N823 Fistula of vagina to large intestine: Secondary | ICD-10-CM

## 2023-01-22 NOTE — Progress Notes (Signed)
GYN VISIT Patient name: Alexis Cain MRN [id number]  Date of birth: 07-17-1960 Chief Complaint:   vaginal issue and Gynecologic Exam (Fistula)  History of Present Illness:   Alexis Cain is a 62 y.o. 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  In the past, she would only leak stool through her vagina if she had a bout of diarrhea.  She now reports that with every BM she has leakage as well as flatus through the vagina.  She denies vaginal discharge itching or irritation.  It sounds as though she thinks this occurred with her first delivery, but it was not discovered until her second delivery in 1985 where repair was attempted, but failed  She does try to avoid diarrhea- taking immodium 2-3x per week.  Denies pelvic or abdominal pain.  No acute complaints   No LMP recorded. Patient is postmenopausal.    Review of Systems:   Pertinent items are noted in HPI Denies fever/chills, dizziness, headaches, visual disturbances, fatigue, shortness of breath, chest pain, abdominal pain, vomiting. Pertinent History Reviewed:   Past Surgical History:  Procedure Laterality Date   BIOPSY  07/09/2022   Procedure: BIOPSY;  Surgeon: Meridee Score Netty Starring., MD;  Location: [facility];  Service: Gastroenterology;;   ESOPHAGOGASTRODUODENOSCOPY N/A 07/09/2022   Procedure: ESOPHAGOGASTRODUODENOSCOPY (EGD);  Surgeon: Lemar Lofty., MD;  Location: [facility];  Service: Gastroenterology;  Laterality: N/A;   EUS N/A 07/09/2022   Procedure: UPPER ENDOSCOPIC ULTRASOUND (EUS) RADIAL;  Surgeon: Lemar Lofty., MD;  Location: [facility];  Service: Gastroenterology;  Laterality: N/A;   FINE NEEDLE ASPIRATION N/A 07/09/2022   Procedure: FINE NEEDLE ASPIRATION (FNA) LINEAR;  Surgeon: Lemar Lofty., MD;  Location: [facility];  Service:  Gastroenterology;  Laterality: N/A;   HERNIA REPAIR     KIDNEY SURGERY Left     Past Medical History:  Diagnosis Date   Anxiety    Arthritis    Family history of pancreatic cancer 08/23/2022   PONV (postoperative nausea and vomiting)    Renal disorder    Reviewed problem list, medications and allergies. Physical Assessment:   Vitals:   01/22/23 1151  BP: 139/81  Pulse: 73  Weight: 236 lb 12.8 oz (107.4 kg)  Height: 5' 5.5" (1.664 m)  Body mass index is 38.81 kg/m.       Physical Examination:   General appearance: alert, well appearing, and in no distress  Psych: mood appropriate, normal affect  Skin: warm & dry   Cardiovascular: normal heart rate noted  Respiratory: normal respiratory effort, no distress  Abdomen: soft, non-tender   Pelvic: VULVA: normal appearing vulva with no masses, tenderness or lesions, VAGINA: normal appearing vagina with normal color and discharge, no lesions- stool not present and no defect initially seen.  CERVIX: normal appearing cervix without discharge or lesions, UTERUS: uterus is normal size, shape, consistency and nontender, ADNEXA: normal adnexa in size, nontender and no masses.    Rectal: on digital exam ~ 1cm palpable defect noted  Extremities: no edema   Chaperone:  Dr. 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   Myna Hidalgo, DO Attending Obstetrician & Gynecologist, [facility] for [employer], [facility]

## 2023-01-24 LAB — CYTOLOGY - PAP
Adequacy: ABSENT
Comment: NEGATIVE
Diagnosis: NEGATIVE
High risk HPV: NEGATIVE

## 2023-07-03 ENCOUNTER — Inpatient Hospital Stay: Payer: Medicaid Other | Attending: Hematology

## 2023-07-03 DIAGNOSIS — K869 Disease of pancreas, unspecified: Secondary | ICD-10-CM | POA: Diagnosis present

## 2023-07-03 DIAGNOSIS — K8689 Other specified diseases of pancreas: Secondary | ICD-10-CM

## 2023-07-03 LAB — COMPREHENSIVE METABOLIC PANEL WITH GFR
ALT: 29 U/L (ref 0–44)
AST: 29 U/L (ref 15–41)
Albumin: 3.9 g/dL (ref 3.5–5.0)
Alkaline Phosphatase: 73 U/L (ref 38–126)
Anion gap: 10 (ref 5–15)
BUN: 12 mg/dL (ref 8–23)
CO2: 27 mmol/L (ref 22–32)
Calcium: 9.5 mg/dL (ref 8.9–10.3)
Chloride: 100 mmol/L (ref 98–111)
Creatinine, Ser: 0.76 mg/dL (ref 0.44–1.00)
GFR, Estimated: >60 mL/min (ref >60)
Glucose, Bld: 109 mg/dL — ABNORMAL HIGH (ref 70–99)
Potassium: 3.9 mmol/L (ref 3.5–5.1)
Sodium: 137 mmol/L (ref 135–145)
Total Bilirubin: 0.5 mg/dL (ref 0.0–1.2)
Total Protein: 7.3 g/dL (ref 6.5–8.1)

## 2023-07-03 LAB — CBC WITH DIFFERENTIAL/PLATELET
Abs Immature Granulocytes: 0.03 10*3/uL (ref 0.00–0.07)
Basophils Absolute: 0 10*3/uL (ref 0.0–0.1)
Basophils Relative: 1 %
Eosinophils Absolute: 0.1 10*3/uL (ref 0.0–0.5)
Eosinophils Relative: 2 %
HCT: 42.9 % (ref 36.0–46.0)
Hemoglobin: 12.8 g/dL (ref 12.0–15.0)
Immature Granulocytes: 1 %
Lymphocytes Relative: 24 %
Lymphs Abs: 1.6 10*3/uL (ref 0.7–4.0)
MCH: 24.7 pg — ABNORMAL LOW (ref 26.0–34.0)
MCHC: 29.8 g/dL — ABNORMAL LOW (ref 30.0–36.0)
MCV: 82.7 fL (ref 80.0–100.0)
Monocytes Absolute: 0.6 10*3/uL (ref 0.1–1.0)
Monocytes Relative: 9 %
Neutro Abs: 4.2 10*3/uL (ref 1.7–7.7)
Neutrophils Relative %: 63 %
Platelets: 262 10*3/uL (ref 150–400)
RBC: 5.19 MIL/uL — ABNORMAL HIGH (ref 3.87–5.11)
RDW: 14.5 % (ref 11.5–15.5)
WBC: 6.6 10*3/uL (ref 4.0–10.5)
nRBC: 0 % (ref 0.0–0.2)

## 2023-07-04 LAB — CANCER ANTIGEN 19-9: CA 19-9: 34 U/mL (ref 0–35)

## 2023-07-09 NOTE — Progress Notes (Signed)
 [facility] [address], [place] [address]    Clinic Day:  07/10/2023  Referring physician: [facility]  Patient Care Team: [facility] as PCP - General Paulett Boros, MD as Medical Oncologist (Medical Oncology) Gerhard Knuckles, RN as Oncology Nurse Navigator (Medical Oncology)   ASSESSMENT & PLAN:   Assessment: 1.  Pancreatic head mass: - Patient went to the ER with symptoms of right-sided pyelonephritis. - CTAP (06/22/2022): 5.6 cm mass in the pancreatic head/uncinate process concerning for primary pancreatic neoplasm.  Associated atrophy of the pancreatic body/tail. - She denies any weight loss or abdominal pain.  Currently on antibiotics cephalexin  500 4 times daily for pyelonephritis. - She has numbness in the feet only when the feet swell after prolonged standing. - EUS/FNA by Dr. Brice Campi: No malignant cells. - She was evaluated by Dr. Almira Armour at [facility]. - She underwent a repeat US  on 08/10/2022: No pancreatic head mass was seen and hence no biopsy was done. - She underwent CT CAP (08/13/2022): Redemonstrated masslike lesion involving pancreatic head with atrophy of the body and tail concerning for malignancy.  Mass abuts portal vein at the portal splenic confluence and SMV without encasement.  Mass also abuts and possibly invades gastric pylorus.  No arterial involvement.  No pathologically enlarged lymph nodes.  Bilateral pulmonary micronodules which are nonspecific.  Scattered osseous sclerotic foci. -PET scan from 08/09/2022: Masslike appearance of the pancreatic head with abrupt parenchymal atrophy of the body and tail.  There is no convincing focal hypermetabolism in the pancreatic head to support diagnosis of pancreatic adenocarcinoma.  Mild/vague hypermetabolism max SUV 3.4.    2.  Social/family history: - Lives at home with her husband.  Currently not working.  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  She also form tobacco many years ago.  She currently vapes for the past 10 years.  She smoked 1 pack of cigarettes per day for 20 years prior to vaping. - Father had cholangiocarcinoma.  Brother had throat cancer and was smoker.  Paternal uncle had lung cancer.  2 maternal aunts had cervical cancer.    Plan: 1.  Pancreatic head mass: - She had robotic assisted cholecystectomy on 04/11/2023.  Pathology was benign with chronic cholecystitis. - I reviewed MRI abdomen from 04/02/2023: No evidence of pancreatic mass.  Fatty infiltration of body and tail of the pancreas.  Diffuse hepatic steatosis. - Reviewed labs from 07/03/2023: Normal LFTs.  CA 19-9 was normal. - She is scheduled for another MRI at [facility] in 6 months from the last MRI.  She will follow-up with them.  I will be glad to see her on an as-needed basis.  No follow-up appointment was given.    No orders of the defined types were placed in this encounter.     Nadeen Augusta Teague,acting as a Neurosurgeon for Paulett Boros, MD.,have documented all relevant documentation on the behalf of Paulett Boros, MD,as directed by  Paulett Boros, MD while in the presence of Paulett Boros, MD.  I, Paulett Boros MD, have reviewed the above documentation for accuracy and completeness, and I agree with the above.     Paulett Boros, MD   5/7/202512:00 PM  CHIEF COMPLAINT:   Diagnosis: pancreatic head mass    Cancer Staging  No matching staging information was found for the patient.    Prior Therapy: none  Current Therapy: Observation   HISTORY OF PRESENT ILLNESS:   Oncology  History   No history exists.     INTERVAL HISTORY:   Alexis Cain is a 63 y.o. female presenting to clinic today for follow up of pancreatic head mass. She was last seen by me on 01/02/23.  Since her last visit, she underwent cholecystectomy on 04/11/23.   Today, she states that she is doing well overall. Her  appetite level is at 50%. Her energy level is at 25%.  PAST MEDICAL HISTORY:   Past Medical History: Past Medical History:  Diagnosis Date   Anxiety    Arthritis    Family history of pancreatic cancer 08/23/2022   PONV (postoperative nausea and vomiting)    Renal disorder     Surgical History: Past Surgical History:  Procedure Laterality Date   BIOPSY  07/09/2022   Procedure: BIOPSY;  Surgeon: Normie Becton., MD;  Location: [facility];  Service: Gastroenterology;;   ESOPHAGOGASTRODUODENOSCOPY N/A 07/09/2022   Procedure: ESOPHAGOGASTRODUODENOSCOPY (EGD);  Surgeon: Normie Becton., MD;  Location: [facility];  Service: Gastroenterology;  Laterality: N/A;   EUS N/A 07/09/2022   Procedure: UPPER ENDOSCOPIC ULTRASOUND (EUS) RADIAL;  Surgeon: Normie Becton., MD;  Location: [facility];  Service: Gastroenterology;  Laterality: N/A;   FINE NEEDLE ASPIRATION N/A 07/09/2022   Procedure: FINE NEEDLE ASPIRATION (FNA) LINEAR;  Surgeon: Normie Becton., MD;  Location: [facility];  Service: Gastroenterology;  Laterality: N/A;   HERNIA REPAIR     KIDNEY SURGERY Left     Social History: Social History   Socioeconomic History   Marital status: Married    Spouse name: Not on file   Number of children: 2   Years of education: Not on file   Highest education level: Not on file  Occupational History   Not on file  Tobacco Use   Smoking status: Every Day    Types: E-cigarettes   Smokeless tobacco: Current  Vaping Use   Vaping status: Every Day  Substance and Sexual Activity   Alcohol use: Yes    Comment: rarely   Drug use: Never   Sexual activity: Yes    Birth control/protection: Post-menopausal  Other Topics Concern   Not on file  Social History Narrative   Not on file   Social Drivers of Health   Financial Resource Strain: Low Risk  (01/22/2023)   Overall Financial Resource Strain (CARDIA)    Difficulty of Paying Living Expenses: Not very hard   Food Insecurity: No Food Insecurity (01/22/2023)   Hunger Vital Sign    Worried About Running Out of Food in the Last Year: Never true    Ran Out of Food in the Last Year: Never true  Transportation Needs: No Transportation Needs (01/22/2023)   PRAPARE - Administrator, Civil Service (Medical): No    Lack of Transportation (Non-Medical): No  Physical Activity: Insufficiently Active (01/22/2023)   Exercise Vital Sign    Days of Exercise per Week: 4 days    Minutes of Exercise per Session: 30 min  Stress: Stress Concern Present (01/22/2023)   [employer] of Occupational Health - Occupational Stress Questionnaire    Feeling of Stress : Rather much  Social Connections: Moderately Integrated (01/22/2023)   Social Connection and Isolation Panel [NHANES]    Frequency of Communication with Friends and Family: More than three times a week    Frequency of Social Gatherings with Friends and Family: More than three times a week    Attends Religious Services: 1 to 4 times per year  Active Member of Clubs or Organizations: No    Attends Banker Meetings: Never    Marital Status: Married  Catering manager Violence: Not At Risk (01/22/2023)   Humiliation, Afraid, Rape, and Kick questionnaire    Fear of Current or Ex-Partner: No    Emotionally Abused: No    Physically Abused: No    Sexually Abused: No    Family History: Family History  Problem Relation Age of Onset   Lupus Paternal Grandfather    Heart Problems Paternal Grandmother    Heart failure Maternal Grandmother    Stroke Maternal Grandfather    Pancreatic cancer Father        bile duct   Throat cancer Brother 35   Cancer Brother    Ovarian cancer Maternal Aunt    Cervical cancer Maternal Aunt    Liver cancer Maternal Uncle    Heart Problems Paternal Aunt    Lung cancer Paternal Uncle    Dementia Paternal Uncle    Heart Problems Paternal Uncle     Current Medications:  Current Outpatient  Medications:    Cholecalciferol (VITAMIN D3) 25 MCG (1000 UT) CAPS, Take 2,000 Units by mouth daily., Disp: , Rfl:    Citalopram Hydrobromide 30 MG CAPS, , Disp: , Rfl:    Cyanocobalamin (VITAMIN B-12 CR PO), Take by mouth., Disp: , Rfl:    Multiple Vitamin (MULTIVITAMIN) tablet, Take 1 tablet by mouth daily., Disp: , Rfl:    Turmeric (QC TUMERIC COMPLEX) 500 MG CAPS, Take 2 tablets by mouth daily., Disp: , Rfl:    Allergies: Allergies  Allergen Reactions   Sulfa Antibiotics Anaphylaxis, Dermatitis, Hives, Itching, Nausea Only, Palpitations and Shortness Of Breath    REVIEW OF SYSTEMS:   Review of Systems  Constitutional:  Negative for chills, fatigue and fever.  HENT:   Negative for lump/mass, mouth sores, nosebleeds, sore throat and trouble swallowing.   Eyes:  Negative for eye problems.  Respiratory:  Negative for cough and shortness of breath.   Cardiovascular:  Negative for chest pain, leg swelling and palpitations.  Gastrointestinal:  Positive for diarrhea. Negative for abdominal pain, constipation, nausea and vomiting.  Genitourinary:  Negative for bladder incontinence, difficulty urinating, dysuria, frequency, hematuria and nocturia.   Musculoskeletal:  Positive for back pain. Negative for arthralgias, flank pain, myalgias and neck pain.  Skin:  Negative for itching and rash.  Neurological:  Positive for dizziness and numbness. Negative for headaches.  Hematological:  Does not bruise/bleed easily.  Psychiatric/Behavioral:  Positive for sleep disturbance. Negative for depression and suicidal ideas. The patient is not nervous/anxious.   All other systems reviewed and are negative.    VITALS:   Blood pressure (!) 146/86, pulse 66, temperature 97.6 F (36.4 C), temperature source Oral, resp. rate 16, weight 236 lb 8.9 oz (107.3 kg), SpO2 100%.  Wt Readings from Last 3 Encounters:  07/10/23 236 lb 8.9 oz (107.3 kg)  01/22/23 236 lb 12.8 oz (107.4 kg)  01/02/23 235 lb (106.6  kg)    Body mass index is 38.77 kg/m.  Performance status (ECOG): 1 - Symptomatic but completely ambulatory  PHYSICAL EXAM:   Physical Exam Vitals and nursing note reviewed. Exam conducted with a chaperone present.  Constitutional:      Appearance: Normal appearance.  Cardiovascular:     Rate and Rhythm: Normal rate and regular rhythm.     Pulses: Normal pulses.     Heart sounds: Normal heart sounds.  Pulmonary:     Effort:  Pulmonary effort is normal.     Breath sounds: Normal breath sounds.  Abdominal:     Palpations: Abdomen is soft. There is no hepatomegaly, splenomegaly or mass.     Tenderness: There is no abdominal tenderness.  Musculoskeletal:     Right lower leg: No edema.     Left lower leg: No edema.  Lymphadenopathy:     Cervical: No cervical adenopathy.     Right cervical: No superficial, deep or posterior cervical adenopathy.    Left cervical: No superficial, deep or posterior cervical adenopathy.     Upper Body:     Right upper body: No supraclavicular or axillary adenopathy.     Left upper body: No supraclavicular or axillary adenopathy.  Neurological:     General: No focal deficit present.     Mental Status: She is alert and oriented to person, place, and time.  Psychiatric:        Mood and Affect: Mood normal.        Behavior: Behavior normal.     LABS:      Latest Ref Rng & Units 07/03/2023   10:45 AM 12/26/2022    1:52 PM 06/21/2022    8:01 PM  CBC  WBC 4.0 - 10.5 K/uL 6.6  6.2  11.0   Hemoglobin 12.0 - 15.0 g/dL 95.6  21.3  08.6   Hematocrit 36.0 - 46.0 % 42.9  43.3  39.8   Platelets 150 - 400 K/uL 262  250  227       Latest Ref Rng & Units 07/03/2023   10:45 AM 12/26/2022    1:52 PM 06/21/2022    9:32 PM  CMP  Glucose 70 - 99 mg/dL 578  469    BUN 8 - 23 mg/dL 12  14    Creatinine 6.29 - 1.00 mg/dL 5.28  4.13    Sodium 244 - 145 mmol/L 137  137    Potassium 3.5 - 5.1 mmol/L 3.9  3.9    Chloride 98 - 111 mmol/L 100  102    CO2 22 - 32  mmol/L 27  25    Calcium 8.9 - 10.3 mg/dL 9.5  8.9    Total Protein 6.5 - 8.1 g/dL 7.3  7.7  7.7   Total Bilirubin 0.0 - 1.2 mg/dL 0.5  0.3  0.4   Alkaline Phos 38 - 126 U/L 73  74  64   AST 15 - 41 U/L 29  30  28    ALT 0 - 44 U/L 29  30  31       No results found for: "CEA1", "CEA" / No results found for: "CEA1", "CEA" No results found for: "PSA1" Lab Results  Component Value Date   [id number] 34 07/03/2023   No results found for: "CAN125"  No results found for: "TOTALPROTELP", "ALBUMINELP", "A1GS", "A2GS", "BETS", "BETA2SER", "GAMS", "MSPIKE", "SPEI" Lab Results  Component Value Date   TIBC 285 06/28/2022   FERRITIN 132 06/28/2022   IRONPCTSAT 13 06/28/2022   No results found for: "LDH"   STUDIES:   No results found.

## 2023-07-10 ENCOUNTER — Inpatient Hospital Stay: Payer: Medicaid Other | Attending: Hematology | Admitting: Hematology

## 2023-07-10 VITALS — BP 146/86 | HR 66 | Temp 97.6°F | Resp 16 | Wt 236.6 lb

## 2023-07-10 DIAGNOSIS — K8689 Other specified diseases of pancreas: Secondary | ICD-10-CM | POA: Diagnosis not present

## 2023-07-10 DIAGNOSIS — K869 Disease of pancreas, unspecified: Secondary | ICD-10-CM | POA: Insufficient documentation

## 2023-07-10 NOTE — Patient Instructions (Addendum)
 [facility] at [facility] [place] Discharge Instructions   You were seen and examined today by Dr. Cheree Cords.  He reviewed the results of your lab work which are normal/stable.   He reviewed the results of your PET scan which did not show any evidence of cancer.   You will not need any further follow up at our clinic unless a need arises in the future.      Thank you for choosing [facility] at [facility] to provide your oncology and hematology care.  To afford each patient quality time with our provider, please arrive at least 15 minutes before your scheduled appointment time.   If you have a lab appointment with the Cancer Center please come in thru the Main Entrance and check in at the main information desk.  You need to re-schedule your appointment should you arrive 10 or more minutes late.  We strive to give you quality time with our providers, and arriving late affects you and other patients whose appointments are after yours.  Also, if you no show three or more times for appointments you may be dismissed from the clinic at the providers discretion.     Again, thank you for choosing [facility]  Our hope is that these requests will decrease the amount of time that you wait before being seen by our physicians.       _____________________________________________________________  Should you have questions after your visit to [facility], please contact our office at [phone] and follow the prompts.  Our office hours are 8:00 a.m. and 4:30 p.m. Monday - Friday.  Please note that voicemails left after 4:00 p.m. may not be returned until the following business day.  We are closed weekends and major holidays.  You do have access to a nurse 24-7, just call the main number to the clinic [phone] and do not press any options, hold on the line and a nurse will answer the phone.    For prescription refill requests, have  your pharmacy contact our office and allow 72 hours.    Due to Covid, you will need to wear a mask upon entering the hospital. If you do not have a mask, a mask will be given to you at the Main Entrance upon arrival. For doctor visits, patients may have 1 support person age 63 or older with them. For treatment visits, patients can not have anyone with them due to social distancing guidelines and our immunocompromised population.
